# Patient Record
Sex: Female | Born: 1995 | Hispanic: Yes | Marital: Married | Smoking: Never smoker
Health system: Southern US, Community
[De-identification: ages and names within clinical notes are randomized; demographics above are authoritative.]

## PROBLEM LIST (undated history)

## (undated) DIAGNOSIS — D649 Anemia, unspecified: Secondary | ICD-10-CM

## (undated) DIAGNOSIS — E559 Vitamin D deficiency, unspecified: Secondary | ICD-10-CM

## (undated) DIAGNOSIS — I10 Essential (primary) hypertension: Secondary | ICD-10-CM

## (undated) HISTORY — DX: Vitamin D deficiency, unspecified: E55.9

## (undated) HISTORY — DX: Anemia, unspecified: D64.9

## (undated) HISTORY — DX: Essential (primary) hypertension: I10

---

## 2013-11-07 DIAGNOSIS — D649 Anemia, unspecified: Secondary | ICD-10-CM | POA: Insufficient documentation

## 2013-11-07 DIAGNOSIS — R55 Syncope and collapse: Secondary | ICD-10-CM | POA: Insufficient documentation

## 2020-08-27 DIAGNOSIS — R072 Precordial pain: Secondary | ICD-10-CM | POA: Diagnosis not present

## 2020-08-27 DIAGNOSIS — Z0000 Encounter for general adult medical examination without abnormal findings: Secondary | ICD-10-CM | POA: Diagnosis not present

## 2020-08-27 DIAGNOSIS — Z1159 Encounter for screening for other viral diseases: Secondary | ICD-10-CM | POA: Diagnosis not present

## 2020-08-27 DIAGNOSIS — R5383 Other fatigue: Secondary | ICD-10-CM | POA: Diagnosis not present

## 2020-08-27 DIAGNOSIS — Z131 Encounter for screening for diabetes mellitus: Secondary | ICD-10-CM | POA: Diagnosis not present

## 2020-08-27 DIAGNOSIS — E559 Vitamin D deficiency, unspecified: Secondary | ICD-10-CM | POA: Diagnosis not present

## 2020-08-27 DIAGNOSIS — D539 Nutritional anemia, unspecified: Secondary | ICD-10-CM | POA: Diagnosis not present

## 2020-08-27 DIAGNOSIS — E538 Deficiency of other specified B group vitamins: Secondary | ICD-10-CM | POA: Diagnosis not present

## 2020-08-27 DIAGNOSIS — H6123 Impacted cerumen, bilateral: Secondary | ICD-10-CM | POA: Diagnosis not present

## 2020-09-03 DIAGNOSIS — R200 Anesthesia of skin: Secondary | ICD-10-CM | POA: Diagnosis not present

## 2020-09-03 DIAGNOSIS — N920 Excessive and frequent menstruation with regular cycle: Secondary | ICD-10-CM | POA: Diagnosis not present

## 2020-09-03 DIAGNOSIS — Z862 Personal history of diseases of the blood and blood-forming organs and certain disorders involving the immune mechanism: Secondary | ICD-10-CM | POA: Diagnosis not present

## 2020-09-03 DIAGNOSIS — E611 Iron deficiency: Secondary | ICD-10-CM | POA: Diagnosis not present

## 2020-09-03 DIAGNOSIS — R5383 Other fatigue: Secondary | ICD-10-CM | POA: Diagnosis not present

## 2020-09-04 DIAGNOSIS — E559 Vitamin D deficiency, unspecified: Secondary | ICD-10-CM | POA: Insufficient documentation

## 2020-10-07 DIAGNOSIS — Z20822 Contact with and (suspected) exposure to covid-19: Secondary | ICD-10-CM | POA: Diagnosis not present

## 2020-10-15 DIAGNOSIS — N854 Malposition of uterus: Secondary | ICD-10-CM | POA: Diagnosis not present

## 2020-10-15 DIAGNOSIS — N920 Excessive and frequent menstruation with regular cycle: Secondary | ICD-10-CM | POA: Diagnosis not present

## 2020-11-12 DIAGNOSIS — R109 Unspecified abdominal pain: Secondary | ICD-10-CM | POA: Diagnosis not present

## 2020-11-12 DIAGNOSIS — R899 Unspecified abnormal finding in specimens from other organs, systems and tissues: Secondary | ICD-10-CM | POA: Diagnosis not present

## 2020-11-12 DIAGNOSIS — R10819 Abdominal tenderness, unspecified site: Secondary | ICD-10-CM | POA: Diagnosis not present

## 2020-11-12 DIAGNOSIS — R7301 Impaired fasting glucose: Secondary | ICD-10-CM | POA: Diagnosis not present

## 2020-11-14 DIAGNOSIS — Z3169 Encounter for other general counseling and advice on procreation: Secondary | ICD-10-CM | POA: Diagnosis not present

## 2020-11-14 DIAGNOSIS — N946 Dysmenorrhea, unspecified: Secondary | ICD-10-CM | POA: Diagnosis not present

## 2020-11-14 DIAGNOSIS — N921 Excessive and frequent menstruation with irregular cycle: Secondary | ICD-10-CM | POA: Diagnosis not present

## 2020-11-18 DIAGNOSIS — R10819 Abdominal tenderness, unspecified site: Secondary | ICD-10-CM | POA: Diagnosis not present

## 2020-11-18 DIAGNOSIS — R109 Unspecified abdominal pain: Secondary | ICD-10-CM | POA: Diagnosis not present

## 2021-01-08 DIAGNOSIS — R5383 Other fatigue: Secondary | ICD-10-CM | POA: Diagnosis not present

## 2021-01-08 DIAGNOSIS — E559 Vitamin D deficiency, unspecified: Secondary | ICD-10-CM | POA: Diagnosis not present

## 2021-01-08 DIAGNOSIS — N926 Irregular menstruation, unspecified: Secondary | ICD-10-CM | POA: Diagnosis not present

## 2021-01-08 DIAGNOSIS — R400 Somnolence: Secondary | ICD-10-CM | POA: Diagnosis not present

## 2021-01-21 DIAGNOSIS — R0683 Snoring: Secondary | ICD-10-CM | POA: Diagnosis not present

## 2021-01-21 DIAGNOSIS — E669 Obesity, unspecified: Secondary | ICD-10-CM | POA: Diagnosis not present

## 2021-01-21 DIAGNOSIS — Z6835 Body mass index (BMI) 35.0-35.9, adult: Secondary | ICD-10-CM | POA: Diagnosis not present

## 2021-01-21 DIAGNOSIS — G4710 Hypersomnia, unspecified: Secondary | ICD-10-CM | POA: Diagnosis not present

## 2021-03-19 DIAGNOSIS — B372 Candidiasis of skin and nail: Secondary | ICD-10-CM | POA: Diagnosis not present

## 2021-03-19 DIAGNOSIS — N926 Irregular menstruation, unspecified: Secondary | ICD-10-CM | POA: Diagnosis not present

## 2021-03-31 ENCOUNTER — Encounter: Payer: Self-pay | Admitting: Emergency Medicine

## 2021-03-31 ENCOUNTER — Emergency Department
Admission: EM | Admit: 2021-03-31 | Discharge: 2021-03-31 | Disposition: A | Discharge status: Home / Self Care | Payer: BC Managed Care – PPO | Source: Home / Self Care | Attending: Family Medicine | Admitting: Family Medicine

## 2021-03-31 ENCOUNTER — Other Ambulatory Visit: Payer: Self-pay

## 2021-03-31 DIAGNOSIS — R1032 Left lower quadrant pain: Secondary | ICD-10-CM | POA: Diagnosis not present

## 2021-03-31 LAB — POCT URINALYSIS DIP (MANUAL ENTRY)
Bilirubin, UA: NEGATIVE
Blood, UA: NEGATIVE
Glucose, UA: NEGATIVE mg/dL
Ketones, POC UA: NEGATIVE mg/dL
Leukocytes, UA: NEGATIVE
Nitrite, UA: NEGATIVE
Protein Ur, POC: NEGATIVE mg/dL
Spec Grav, UA: 1.025 (ref 1.010–1.025)
Urobilinogen, UA: 0.2 E.U./dL
pH, UA: 7 (ref 5.0–8.0)

## 2021-03-31 LAB — POCT URINE PREGNANCY: Preg Test, Ur: NEGATIVE

## 2021-03-31 MED ORDER — KETOROLAC TROMETHAMINE 60 MG/2ML IM SOLN
60.0000 mg | Freq: Once | INTRAMUSCULAR | Status: AC
  Administered 2021-03-31: 60 mg via INTRAMUSCULAR

## 2021-03-31 NOTE — ED Notes (Signed)
Pt setting up a new PCP appointment - switching from [facility]

## 2021-03-31 NOTE — ED Triage Notes (Signed)
Left lower  quadrant pain since Sunday night  Feels tight  No OTC meds  Denies diarrhea or constipation Period of nausea Here w/ her husband

## 2021-03-31 NOTE — ED Provider Notes (Signed)
[name]    CSN: [id number] Arrival date & time: 03/31/21  0859      History   Chief Complaint Chief Complaint  Patient presents with   Abdominal Pain    HPI Tanya Yates is a 26 y.o. female.   HPI  Healthy 26 year old here with left lower quadrant abdominal pain.  Crampy in nature.  Comes and goes.  Worse with deep breath.  Present since Sunday (2 days ago).  No fever or chills.  Decreased appetite but no vomiting.  No diarrhea.  Last bowel movement was today.  Last menstrual period was 03/17/2020. Urinalysis and urine pregnancy test are negative today. No history of kidney stones or kidney infections.  No history of GYN medical problems or ovarian cysts.  No colon problems, colitis, food allergies.  History reviewed. No pertinent past medical history.  There are no problems to display for this patient.   History reviewed. No pertinent surgical history.  OB History   No obstetric history on file.      Home Medications    Prior to Admission medications   Medication Sig Start Date End Date Taking? Authorizing Provider  clotrimazole-betamethasone (LOTRISONE) lotion Apply topically. 03/19/21   [provider]    Family History Family History  Problem Relation Age of Onset   Healthy Mother    Healthy Father     Social History Social History   Tobacco Use   Smoking status: Never    Passive exposure: Never   Smokeless tobacco: Never  Vaping Use   Vaping Use: Never used  Substance Use Topics   Alcohol use: Not Currently   Drug use: Never     Allergies   Patient has no known allergies.   Review of Systems Review of Systems See HPI  Physical Exam Triage Vital Signs ED Triage Vitals  Enc Vitals Group     BP 03/31/21 0922 (!) 137/92     Pulse Rate 03/31/21 0922 (!) 112     Resp 03/31/21 0922 16     Temp 03/31/21 0922 98.6 F (37 C)     Temp Source 03/31/21 0922 Oral     SpO2 03/31/21 0922 99 %     Weight  03/31/21 0924 204 lb (92.5 kg)     Height 03/31/21 0924 5\' 3"  (1.6 m)     Head Circumference --      Peak Flow --      Pain Score 03/31/21 0923 3     Pain Loc --      Pain Edu? --      Excl. in [place]? --    No data found.  Updated Vital Signs BP (!) 137/92 (BP Location: Left Arm)    Pulse 79    Temp 98.6 F (37 C) (Oral)    Resp 16    Ht 5\' 3"  (1.6 m)    Wt 92.5 kg    LMP 03/17/2021 (Exact Date)    SpO2 98%    BMI 36.14 kg/m      Physical Exam Constitutional:      General: She is not in acute distress.    Appearance: She is well-developed. She is obese.     Comments: Appears uncomfortable.  HENT:     Head: Normocephalic and atraumatic.     Mouth/Throat:     Comments: Mask is in place Eyes:     Conjunctiva/sclera: Conjunctivae normal.     Pupils: Pupils are equal, round, and reactive to light.  Cardiovascular:     Rate and Rhythm: Normal rate and regular rhythm.     Heart sounds: Normal heart sounds.  Pulmonary:     Effort: Pulmonary effort is normal. No respiratory distress.     Breath sounds: Normal breath sounds.  Chest:     Chest wall: No tenderness.  Abdominal:     General: Abdomen is flat. There is no distension.     Palpations: Abdomen is soft.     Tenderness: There is abdominal tenderness. There is no right CVA tenderness, left CVA tenderness, guarding or rebound.     Comments: Moderate tenderness to deep palpation in the left lower and mid abdomen.  No organomegaly.  No mass.  No guarding or rebound.  Musculoskeletal:        General: Normal range of motion.     Cervical back: Normal range of motion.  Skin:    General: Skin is warm and dry.  Neurological:     Mental Status: She is alert.     UC Treatments / Results  Labs (all labs ordered are listed, but only abnormal results are displayed) Labs Reviewed  POCT URINE PREGNANCY  POCT URINALYSIS DIP (MANUAL ENTRY)    EKG   Radiology No results found.  Procedures Procedures (including critical care  time)  Medications Ordered in UC Medications  ketorolac (TORADOL) injection 60 mg (60 mg Intramuscular Given 03/31/21 0959)    Initial Impression / Assessment and Plan / UC Course  I have reviewed the triage vital signs and the nursing notes.  Pertinent labs & imaging results that were available during my care of the patient were reviewed by me and considered in my medical decision making (see chart for details).     We will give a shot of Toradol.  Bland diet.  Probiotics.  Watch for worsening symptoms.  Would do additional evaluation or scanning if she fails to improve, or symptoms worsen.  Discussed with patient and husband Final Clinical Impressions(s) / UC Diagnoses   Final diagnoses:  None     Discharge Instructions      Take Aleve 2 pills in the morning and 2 pills at night for the abdominal crampy pain Drink lots of water. Avoid highly spicy food and fried foods until the pain improves Consider a probiotic to reduce colon gas and pain Return here or go to the ER if you become much worse, have fevers, have vomiting   ED Prescriptions   None    PDMP not reviewed this encounter.   Raylene Everts, MD 03/31/21 1054

## 2021-03-31 NOTE — Discharge Instructions (Signed)
Take Aleve 2 pills in the morning and 2 pills at night for the abdominal crampy pain Drink lots of water. Avoid highly spicy food and fried foods until the pain improves Consider a probiotic to reduce colon gas and pain Return here or go to the ER if you become much worse, have fevers, have vomiting

## 2021-04-02 ENCOUNTER — Emergency Department
Admission: RE | Admit: 2021-04-02 | Discharge: 2021-04-02 | Disposition: A | Discharge status: Home / Self Care | Payer: BC Managed Care – PPO | Source: Ambulatory Visit

## 2021-04-02 ENCOUNTER — Emergency Department (INDEPENDENT_AMBULATORY_CARE_PROVIDER_SITE_OTHER): Payer: BC Managed Care – PPO

## 2021-04-02 ENCOUNTER — Other Ambulatory Visit: Payer: Self-pay

## 2021-04-02 VITALS — BP 146/95 | HR 102 | Temp 98.6°F | Resp 18

## 2021-04-02 DIAGNOSIS — K6389 Other specified diseases of intestine: Secondary | ICD-10-CM

## 2021-04-02 DIAGNOSIS — R1084 Generalized abdominal pain: Secondary | ICD-10-CM

## 2021-04-02 DIAGNOSIS — K5900 Constipation, unspecified: Secondary | ICD-10-CM | POA: Diagnosis not present

## 2021-04-02 DIAGNOSIS — R109 Unspecified abdominal pain: Secondary | ICD-10-CM | POA: Diagnosis not present

## 2021-04-02 DIAGNOSIS — R1032 Left lower quadrant pain: Secondary | ICD-10-CM

## 2021-04-02 MED ORDER — IOHEXOL 300 MG/ML  SOLN
100.0000 mL | Freq: Once | INTRAMUSCULAR | Status: AC | PRN
  Administered 2021-04-02: 100 mL via INTRAVENOUS

## 2021-04-02 NOTE — Discharge Instructions (Addendum)
Advised/informed patient of results of CT of abdomen and pelvis with contrast.  Provided hard copy for patient with this AVS.  Advised patient to follow-up with PCP for GI consult given impression of this CT study for Epiploic appendagitis and benign appearing right ovarian cyst.

## 2021-04-02 NOTE — ED Provider Notes (Signed)
[facility]    CSN: [id number] Arrival date & time: 04/02/21  0954      History   Chief Complaint Chief Complaint  Patient presents with   Abdominal Pain    W/ cramping    HPI Tanya Yates is a 26 y.o. female.   HPI 26 year old female presents with abdominal pain and cramping for days.  Denies nausea and vomiting.  Feels bloated and constipated.  Patient was a weighted here on Tuesday, 03/31/2021 (2 days ago) was given Toradol and recommended to take ibuprofen for abdominal pain.  She has had abdominal ultrasound performed by GYN on 11/14/2020 which was in normal limits and unremarkable this provider recommended GI consult at that time.  History reviewed. No pertinent past medical history.  There are no problems to display for this patient.   History reviewed. No pertinent surgical history.  OB History   No obstetric history on file.      Home Medications    Prior to Admission medications   Medication Sig Start Date End Date Taking? Authorizing Provider  clotrimazole-betamethasone (LOTRISONE) lotion Apply topically. 03/19/21   [provider]    Family History Family History  Problem Relation Age of Onset   Healthy Mother    Healthy Father     Social History Social History   Tobacco Use   Smoking status: Never    Passive exposure: Never   Smokeless tobacco: Never  Vaping Use   Vaping Use: Never used  Substance Use Topics   Alcohol use: Not Currently   Drug use: Never     Allergies   Patient has no known allergies.   Review of Systems Review of Systems  Gastrointestinal:  Positive for abdominal pain.  All other systems reviewed and are negative.   Physical Exam Triage Vital Signs ED Triage Vitals  Enc Vitals Group     BP 04/02/21 1021 (!) 146/95     Pulse Rate 04/02/21 1021 (!) 102     Resp 04/02/21 1021 18     Temp 04/02/21 1021 98.6 F (37 C)     Temp Source 04/02/21 1021 Oral     SpO2 04/02/21 1021  98 %     Weight --      Height --      Head Circumference --      Peak Flow --      Pain Score 04/02/21 1022 7     Pain Loc --      Pain Edu? --      Excl. in [place]? --    No data found.  Updated Vital Signs BP (!) 146/95 (BP Location: Right Arm)    Pulse (!) 102    Temp 98.6 F (37 C) (Oral)    Resp 18    LMP 03/17/2021 (Exact Date)    SpO2 98%      Physical Exam Vitals and nursing note reviewed.  Constitutional:      General: She is not in acute distress.    Appearance: She is well-developed. She is obese. She is not ill-appearing.  HENT:     Head: Normocephalic and atraumatic.     Mouth/Throat:     Mouth: Mucous membranes are moist.     Pharynx: Oropharynx is clear.  Eyes:     Extraocular Movements: Extraocular movements intact.     Pupils: Pupils are equal, round, and reactive to light.  Cardiovascular:     Rate and Rhythm: Normal rate and regular rhythm.  Heart sounds: Normal heart sounds. No murmur heard.   No friction rub. No gallop.  Pulmonary:     Effort: Pulmonary effort is normal.     Breath sounds: Normal breath sounds.  Abdominal:     General: Abdomen is flat. Bowel sounds are absent. There is no distension or abdominal bruit.     Palpations: Abdomen is soft. There is no shifting dullness, fluid wave, hepatomegaly, splenomegaly, mass or pulsatile mass.     Tenderness: There is abdominal tenderness in the left lower quadrant. There is no right CVA tenderness, left CVA tenderness, guarding or rebound. Negative signs include Murphy's sign and McBurney's sign.     Hernia: No hernia is present.  Neurological:     Mental Status: She is alert.     UC Treatments / Results  Labs (all labs ordered are listed, but only abnormal results are displayed) Labs Reviewed - No data to display  EKG   Radiology DG Abdomen 1 View  Result Date: 04/02/2021 CLINICAL DATA:  Abdominal pain and cramping for 4 days. Bloating. Constipation. EXAM: ABDOMEN - 1 VIEW COMPARISON:   None. FINDINGS: There is some formed stool in the ascending colon but overall the stool burden in the abdomen is relatively low and within normal limits. No dilated bowel is identified, although most of the small bowel is gasless. No significant abnormal calcifications are identified. Mild levoconvex lumbar rotary scoliosis. IMPRESSION: 1. Unremarkable bowel gas pattern. 2. Mild levoconvex lumbar rotary scoliosis. Electronically Signed   By: Van Clines M.D.   On: 04/02/2021 10:52   CT ABDOMEN PELVIS W CONTRAST  Result Date: 04/02/2021 CLINICAL DATA:  Left lower quadrant abdominal pain with cramping. EXAM: CT ABDOMEN AND PELVIS WITH CONTRAST TECHNIQUE: Multidetector CT imaging of the abdomen and pelvis was performed using the standard protocol following bolus administration of intravenous contrast. RADIATION DOSE REDUCTION: This exam was performed according to the departmental dose-optimization program which includes automated exposure control, adjustment of the mA and/or kV according to patient size and/or use of iterative reconstruction technique. CONTRAST:  116mL OMNIPAQUE IOHEXOL 300 MG/ML  SOLN COMPARISON:  None. FINDINGS: Lower chest: Unremarkable Hepatobiliary: No suspicious focal abnormality within the liver parenchyma. There is no evidence for gallstones, gallbladder wall thickening, or pericholecystic fluid. No intrahepatic or extrahepatic biliary dilation. Pancreas: No focal mass lesion. No dilatation of the main duct. No intraparenchymal cyst. No peripancreatic edema. Spleen: No splenomegaly. No focal mass lesion. Adrenals/Urinary Tract: No adrenal nodule or mass. Kidneys unremarkable. No evidence for hydroureter. The urinary bladder appears normal for the degree of distention. Stomach/Bowel: Stomach is unremarkable. No gastric wall thickening. No evidence of outlet obstruction. Duodenum is normally positioned as is the ligament of Treitz. No small bowel wall thickening. No small bowel  dilatation. The terminal ileum is normal. The appendix is normal. No gross colonic mass. No colonic wall thickening. Oval ring of edema/inflammation identified immediately adjacent to the distal descending colon (well demonstrated axial image 54 series 2) is characteristic for epiploic appendagitis. Vascular/Lymphatic: No abdominal aortic aneurysm. There is no gastrohepatic or hepatoduodenal ligament lymphadenopathy. No retroperitoneal or mesenteric lymphadenopathy. No pelvic sidewall lymphadenopathy. Reproductive: The uterus is unremarkable. 3.1 cm benign appearing cyst or dominant follicle noted right ovary. Left ovary unremarkable. Other: No intraperitoneal free fluid. Musculoskeletal: No worrisome lytic or sclerotic osseous abnormality. IMPRESSION: 1. Oval ring of edema/inflammation immediately adjacent to the distal descending colon is characteristic for epiploic appendagitis. 2. 3.1 cm benign appearing cyst or dominant follicle noted right ovary. No  follow-up imaging recommended. Note: This recommendation does not apply to premenarchal patients and to those with increased risk (genetic, family history, elevated tumor markers or other high-risk factors) of ovarian cancer. Reference: JACR 2020 Feb; 17(2):248-254 Electronically Signed   By: Misty Stanley M.D.   On: 04/02/2021 11:46    Procedures Procedures (including critical care time)  Medications Ordered in UC Medications - No data to display  Initial Impression / Assessment and Plan / UC Course  I have reviewed the triage vital signs and the nursing notes.  Pertinent labs & imaging results that were available during my care of the patient were reviewed by me and considered in my medical decision making (see chart for details).     MDM: 1.  Left lower quadrant abdominal pain-KUB revealed above-unremarkable bowel gas pattern mild levoconvex lumbar rotary scoliosis.  CT of abdomen and pelvis with contrast revealed above. Advised/informed patient  of results of CT of abdomen and pelvis with contrast.  Provided hard copy for patient with this AVS.  Advised patient to follow-up with PCP for GI consult given impression of this CT study for Epiploic appendagitis and benign appearing right ovarian cyst.  Work note provided prior to discharge.  Discharged home, hemodynamically stable. Final Clinical Impressions(s) / UC Diagnoses   Final diagnoses:  Abdominal pain, left lower quadrant  Epiploic appendagitis     Discharge Instructions      Advised/informed patient of results of CT of abdomen and pelvis with contrast.  Provided hard copy for patient with this AVS.  Advised patient to follow-up with PCP for GI consult given impression of this CT study for Epiploic appendagitis and benign appearing right ovarian cyst.     ED Prescriptions   None    PDMP not reviewed this encounter.   Eliezer Lofts, [place] 04/02/21 1226

## 2021-04-02 NOTE — ED Triage Notes (Addendum)
Pt here today with husband. Pt c/o adb pain and cramping that started Sunday. Pain is intermittent. Denies nausea and vomiting. Feels bloated and constipated. Was seen here at [facility] on Tues. Given Toradol. Also recommended to take Ibuprofen prn.

## 2021-04-08 DIAGNOSIS — N83201 Unspecified ovarian cyst, right side: Secondary | ICD-10-CM | POA: Diagnosis not present

## 2021-04-08 DIAGNOSIS — K6389 Other specified diseases of intestine: Secondary | ICD-10-CM | POA: Diagnosis not present

## 2021-04-13 DIAGNOSIS — K6389 Other specified diseases of intestine: Secondary | ICD-10-CM | POA: Diagnosis not present

## 2021-05-25 ENCOUNTER — Ambulatory Visit: Payer: BC Managed Care – PPO | Admitting: Medical-Surgical

## 2021-05-26 DIAGNOSIS — N926 Irregular menstruation, unspecified: Secondary | ICD-10-CM | POA: Diagnosis not present

## 2021-05-26 DIAGNOSIS — R350 Frequency of micturition: Secondary | ICD-10-CM | POA: Diagnosis not present

## 2021-07-20 DIAGNOSIS — L299 Pruritus, unspecified: Secondary | ICD-10-CM | POA: Diagnosis not present

## 2021-07-20 DIAGNOSIS — R5383 Other fatigue: Secondary | ICD-10-CM | POA: Diagnosis not present

## 2021-07-20 DIAGNOSIS — R221 Localized swelling, mass and lump, neck: Secondary | ICD-10-CM | POA: Diagnosis not present

## 2021-07-20 DIAGNOSIS — B372 Candidiasis of skin and nail: Secondary | ICD-10-CM | POA: Diagnosis not present

## 2021-09-02 DIAGNOSIS — D508 Other iron deficiency anemias: Secondary | ICD-10-CM | POA: Diagnosis not present

## 2021-09-02 DIAGNOSIS — R0600 Dyspnea, unspecified: Secondary | ICD-10-CM | POA: Diagnosis not present

## 2021-09-02 DIAGNOSIS — R531 Weakness: Secondary | ICD-10-CM | POA: Diagnosis not present

## 2021-09-02 DIAGNOSIS — R002 Palpitations: Secondary | ICD-10-CM | POA: Diagnosis not present

## 2021-09-02 DIAGNOSIS — E559 Vitamin D deficiency, unspecified: Secondary | ICD-10-CM | POA: Diagnosis not present

## 2021-09-02 DIAGNOSIS — R5383 Other fatigue: Secondary | ICD-10-CM | POA: Diagnosis not present

## 2021-09-23 DIAGNOSIS — R55 Syncope and collapse: Secondary | ICD-10-CM | POA: Diagnosis not present

## 2021-09-23 DIAGNOSIS — R079 Chest pain, unspecified: Secondary | ICD-10-CM | POA: Diagnosis not present

## 2021-09-23 DIAGNOSIS — R0602 Shortness of breath: Secondary | ICD-10-CM | POA: Diagnosis not present

## 2021-10-13 DIAGNOSIS — D508 Other iron deficiency anemias: Secondary | ICD-10-CM | POA: Diagnosis not present

## 2021-10-13 DIAGNOSIS — Z124 Encounter for screening for malignant neoplasm of cervix: Secondary | ICD-10-CM | POA: Diagnosis not present

## 2021-10-13 DIAGNOSIS — L659 Nonscarring hair loss, unspecified: Secondary | ICD-10-CM | POA: Diagnosis not present

## 2021-10-13 DIAGNOSIS — E669 Obesity, unspecified: Secondary | ICD-10-CM | POA: Diagnosis not present

## 2021-10-13 DIAGNOSIS — E559 Vitamin D deficiency, unspecified: Secondary | ICD-10-CM | POA: Diagnosis not present

## 2021-10-13 DIAGNOSIS — Z23 Encounter for immunization: Secondary | ICD-10-CM | POA: Diagnosis not present

## 2021-10-13 DIAGNOSIS — Z0000 Encounter for general adult medical examination without abnormal findings: Secondary | ICD-10-CM | POA: Diagnosis not present

## 2021-10-13 DIAGNOSIS — R5383 Other fatigue: Secondary | ICD-10-CM | POA: Diagnosis not present

## 2021-10-20 LAB — HM PAP SMEAR

## 2021-10-22 DIAGNOSIS — E611 Iron deficiency: Secondary | ICD-10-CM | POA: Diagnosis not present

## 2021-10-22 DIAGNOSIS — E559 Vitamin D deficiency, unspecified: Secondary | ICD-10-CM | POA: Diagnosis not present

## 2021-10-22 DIAGNOSIS — N920 Excessive and frequent menstruation with regular cycle: Secondary | ICD-10-CM | POA: Diagnosis not present

## 2021-10-22 DIAGNOSIS — R5383 Other fatigue: Secondary | ICD-10-CM | POA: Diagnosis not present

## 2022-02-24 ENCOUNTER — Ambulatory Visit (INDEPENDENT_AMBULATORY_CARE_PROVIDER_SITE_OTHER): Payer: BC Managed Care – PPO

## 2022-02-24 ENCOUNTER — Ambulatory Visit
Admission: RE | Admit: 2022-02-24 | Discharge: 2022-02-24 | Disposition: A | Discharge status: Home / Self Care | Payer: BC Managed Care – PPO | Source: Ambulatory Visit | Attending: Family Medicine | Admitting: Family Medicine

## 2022-02-24 VITALS — BP 138/97 | HR 108 | Temp 98.5°F | Resp 14

## 2022-02-24 DIAGNOSIS — M2142 Flat foot [pes planus] (acquired), left foot: Secondary | ICD-10-CM

## 2022-02-24 DIAGNOSIS — M79671 Pain in right foot: Secondary | ICD-10-CM

## 2022-02-24 DIAGNOSIS — M2141 Flat foot [pes planus] (acquired), right foot: Secondary | ICD-10-CM

## 2022-02-24 DIAGNOSIS — M7661 Achilles tendinitis, right leg: Secondary | ICD-10-CM

## 2022-02-24 MED ORDER — PREDNISONE 50 MG PO TABS: ORAL_TABLET | ORAL | 0 refills | Status: DC

## 2022-02-24 NOTE — ED Provider Notes (Signed)
[facility]    CSN: [id number] Arrival date & time: 02/24/22  1857      History   Chief Complaint Chief Complaint  Patient presents with   Foot Pain    HPI Tanya Yates is a 26 y.o. female.   HPI Patient had absolutely normal day Monday.  Woke up Tuesday and could not put weight on her right heel.  Has been limping on it ever since then.  She works on her feet a lot of the time.  She wears a steel toe shoe.  No recent change in activity.  No recent change in shoewear.  No trauma or injury. History reviewed. No pertinent past medical history.  There are no problems to display for this patient.   History reviewed. No pertinent surgical history.  OB History   No obstetric history on file.      Home Medications    Prior to Admission medications   Medication Sig Start Date End Date Taking? Authorizing Provider  predniSONE (DELTASONE) 50 MG tablet Take once a day for 5 days.  Take with food 02/24/22  Yes Eustace Moore, MD    Family History Family History  Problem Relation Age of Onset   Healthy Mother    Healthy Father     Social History Social History   Tobacco Use   Smoking status: Never    Passive exposure: Never   Smokeless tobacco: Never  Vaping Use   Vaping Use: Never used  Substance Use Topics   Alcohol use: Not Currently   Drug use: Never     Allergies   Patient has no known allergies.   Review of Systems Review of Systems See HPI  Physical Exam Triage Vital Signs ED Triage Vitals  Enc Vitals Group     BP 02/24/22 1909 (!) 138/97     Pulse Rate 02/24/22 1909 (!) 108     Resp 02/24/22 1909 14     Temp 02/24/22 1909 98.5 F (36.9 C)     Temp Source 02/24/22 1909 Oral     SpO2 02/24/22 1909 98 %     Weight --      Height --      Head Circumference --      Peak Flow --      Pain Score 02/24/22 1908 6     Pain Loc --      Pain Edu? --      Excl. in [place]? --    No data found.  Updated Vital  Signs BP (!) 138/97 (BP Location: Right Arm)   Pulse (!) 108   Temp 98.5 F (36.9 C) (Oral)   Resp 14   SpO2 98%       Physical Exam Constitutional:      General: She is not in acute distress.    Appearance: She is well-developed.  HENT:     Head: Normocephalic and atraumatic.  Eyes:     Conjunctiva/sclera: Conjunctivae normal.     Pupils: Pupils are equal, round, and reactive to light.  Cardiovascular:     Rate and Rhythm: Normal rate.  Pulmonary:     Effort: Pulmonary effort is normal. No respiratory distress.  Abdominal:     General: There is no distension.     Palpations: Abdomen is soft.  Musculoskeletal:        General: Tenderness present. No swelling, deformity or signs of injury. Normal range of motion.     Cervical back: Normal range of  motion.     Right lower leg: No edema.     Left lower leg: Edema present.       Feet:  Skin:    General: Skin is warm and dry.  Neurological:     Mental Status: She is alert.     Gait: Gait abnormal.      UC Treatments / Results  Labs (all labs ordered are listed, but only abnormal results are displayed) Labs Reviewed - No data to display  EKG   Radiology DG Foot 2 Views Right  Result Date: 02/24/2022 CLINICAL DATA:  Pain right heel EXAM: RIGHT FOOT - 2 VIEW COMPARISON:  None Available. FINDINGS: No fracture or dislocation is seen. There is no demonstrable plantar spur. No abnormal soft tissue calcifications are seen. There are no opaque foreign bodies. IMPRESSION: No radiographic abnormalities are seen in right foot with attention to the heel. Electronically Signed   By: Ernie Avena M.D.   On: 02/24/2022 19:36    Procedures Procedures (including critical care time)  Medications Ordered in UC Medications - No data to display  Initial Impression / Assessment and Plan / UC Course  I have reviewed the triage vital signs and the nursing notes.  Pertinent labs & imaging results that were available during my  care of the patient were reviewed by me and considered in my medical decision making (see chart for details).     Will commend stretching, ice, anti-inflammatories, reexamine shoewear, see sports medicine if fails to improve Final Clinical Impressions(s) / UC Diagnoses   Final diagnoses:  Pain of right heel  Achilles tendinitis of right lower extremity  Pes planus of both feet     Discharge Instructions      Stretch heel as I demonstrated Use ice for 20 minutes every couple of hours Take prednisone once a day for 5 days When you are finished with the prednisone take ibuprofen as needed for pain, 600 mg to 800 mg with food If you continue to have pain consider sports medicine specialty    ED Prescriptions     Medication Sig Dispense Auth. Provider   predniSONE (DELTASONE) 50 MG tablet Take once a day for 5 days.  Take with food 5 tablet Eustace Moore, MD      PDMP not reviewed this encounter.   Eustace Moore, MD 02/24/22 Rosamaria Lints

## 2022-02-24 NOTE — Discharge Instructions (Signed)
Stretch heel as I demonstrated Use ice for 20 minutes every couple of hours Take prednisone once a day for 5 days When you are finished with the prednisone take ibuprofen as needed for pain, 600 mg to 800 mg with food If you continue to have pain consider sports medicine specialty

## 2022-02-24 NOTE — ED Triage Notes (Signed)
Pt presents with rt heel pain began after waking up yesterday AM. Pt denies any injuries

## 2022-05-28 ENCOUNTER — Ambulatory Visit: Payer: BC Managed Care – PPO | Admitting: Family Medicine

## 2022-06-15 ENCOUNTER — Encounter: Payer: Self-pay | Admitting: Family Medicine

## 2022-06-15 ENCOUNTER — Ambulatory Visit: Payer: BC Managed Care – PPO | Admitting: Family Medicine

## 2022-06-15 VITALS — BP 143/102 | HR 113 | Temp 98.3°F | Ht 63.0 in | Wt 206.0 lb

## 2022-06-15 DIAGNOSIS — Z6836 Body mass index (BMI) 36.0-36.9, adult: Secondary | ICD-10-CM

## 2022-06-15 DIAGNOSIS — R5383 Other fatigue: Secondary | ICD-10-CM | POA: Diagnosis not present

## 2022-06-15 DIAGNOSIS — D509 Iron deficiency anemia, unspecified: Secondary | ICD-10-CM

## 2022-06-15 DIAGNOSIS — R635 Abnormal weight gain: Secondary | ICD-10-CM

## 2022-06-15 DIAGNOSIS — E559 Vitamin D deficiency, unspecified: Secondary | ICD-10-CM | POA: Diagnosis not present

## 2022-06-15 DIAGNOSIS — I10 Essential (primary) hypertension: Secondary | ICD-10-CM | POA: Diagnosis not present

## 2022-06-15 MED ORDER — LISINOPRIL 10 MG PO TABS: 10.0000 mg | ORAL_TABLET | Freq: Every day | ORAL | 3 refills | Status: DC

## 2022-06-15 NOTE — Assessment & Plan Note (Signed)
-   have ordered b12 level

## 2022-06-15 NOTE — Progress Notes (Signed)
Established patient visit   Patient: Tanya Yates   DOB: 15-Jun-1995   26 y.o. Female  MRN: [id number] Visit Date: 06/15/2022  Today's healthcare provider: Charlton Amor, DO   Chief Complaint  Patient presents with   Establish Care   Fatigue    Pt would like to check iron levels.    Menorrhagia    She says she has-had since 27 y/o.   Hypertension    Pt was recently seen at the Dentist and was told her BP was elevated. She complains of HA, facial swelling and HA. She denies CP, SOB or visual changes.    SUBJECTIVE     HPI HPI     Fatigue    Additional comments: Pt would like to check iron levels.         Menorrhagia    Additional comments: She says she has-had since 27 y/o.        Hypertension    Additional comments: Pt was recently seen at the Dentist and was told her BP was elevated. She complains of HA, facial swelling and HA. She denies CP, SOB or visual changes.      Last edited by Yolanda Manges, CMA on 06/15/2022  4:09 PM.      Pt presents to establish care. She has concerns of fatigue today. She has a history of IDA. She has migraines when she takes iron supplements. She feels like she sleeps all the time.  She is struggling for weight loss. She is walking 3 days a week for 1 mile and a half. She drinks a lot of water and tries to do small portions. This is the heaviest she has ever been. BP is elevated.   Review of Systems  Constitutional:  Negative for activity change, fatigue and fever.  Respiratory:  Negative for cough and shortness of breath.   Cardiovascular:  Negative for chest pain.  Gastrointestinal:  Negative for abdominal pain.  Genitourinary:  Negative for difficulty urinating.       Current Meds  Medication Sig   lisinopril (ZESTRIL) 10 MG tablet Take 1 tablet (10 mg total) by mouth daily.    OBJECTIVE    BP (!) 143/102 (BP Location: Left Arm, Patient Position: Sitting, Cuff Size: Large)   Pulse (!) 113    Temp 98.3 F (36.8 C) (Oral)   Ht  (1.6 m)   Wt 206 lb 0.6 oz (93.5 kg)   LMP 06/12/2022 (Exact Date)   SpO2 100%   BMI 36.50 kg/m   Physical Exam Vitals and nursing note reviewed.  Constitutional:      General: She is not in acute distress.    Appearance: Normal appearance.  HENT:     Head: Normocephalic and atraumatic.     Right Ear: External ear normal.     Left Ear: External ear normal.     Nose: Nose normal.  Eyes:     Conjunctiva/sclera: Conjunctivae normal.  Cardiovascular:     Rate and Rhythm: Normal rate and regular rhythm.  Pulmonary:     Effort: Pulmonary effort is normal.     Breath sounds: Normal breath sounds.  Neurological:     General: No focal deficit present.     Mental Status: She is alert and oriented to person, place, and time.  Psychiatric:        Mood and Affect: Mood normal.        Behavior: Behavior normal.  Thought Content: Thought content normal.        Judgment: Judgment normal.        ASSESSMENT & PLAN    Problem List Items Addressed This Visit       Cardiovascular and Mediastinum   Primary hypertension    - elevated bp and pt has elevated bp at the dentist. Have sent in lisinopril  daily  - will follow up in two weeks for reassessment      Relevant Medications   lisinopril (ZESTRIL) 10 MG tablet   Other Relevant Orders   COMPLETE METABOLIC PANEL WITH GFR   Microalbumin / creatinine urine ratio     Other   Other fatigue - Primary    - have ordered b12 level        Relevant Orders   Vitamin B12   Anemia    - pt has hx of IDA secondary to heavy periods will order iron panel to assess  - if abnormal pt will need infusion as she cannot tolerate oral supplements      Relevant Orders   Fe+TIBC+Fer   Vitamin D deficiency    - have ordered vit d lab to assess for need for supplement      Relevant Orders   Vitamin D (25 hydroxy)   Class 2 severe obesity due to excess calories with serious comorbidity and  body mass index (BMI) of 36.0 to 36.9 in adult    - have ordered A1c to assess for diabetes - if A1c is negative, will do wegovy      Relevant Orders   Vitamin D (25 hydroxy)   HgB A1c   Other Visit Diagnoses     Weight gain       Relevant Orders   TSH + free T4   HgB A1c       Return in about 2 weeks (around 06/29/2022).      Meds ordered this encounter  Medications   lisinopril (ZESTRIL) 10 MG tablet    Sig: Take 1 tablet (10 mg total) by mouth daily.    Dispense:  90 tablet    Refill:  3    Orders Placed This Encounter  Procedures   Vitamin D (25 hydroxy)   Vitamin B12   TSH + free T4   HgB A1c   COMPLETE METABOLIC PANEL WITH GFR   Microalbumin / creatinine urine ratio   Fe+TIBC+Fer     Charlton Amor, DO  [facility] Primary Care & Sports Medicine at [facility] [phone] (phone) [phone] (fax)  [facility]

## 2022-06-15 NOTE — Assessment & Plan Note (Signed)
-   have ordered vit d lab to assess for need for supplement

## 2022-06-15 NOTE — Assessment & Plan Note (Signed)
-   pt has hx of IDA secondary to heavy periods will order iron panel to assess  - if abnormal pt will need infusion as she cannot tolerate oral supplements

## 2022-06-15 NOTE — Assessment & Plan Note (Signed)
-   have ordered A1c to assess for diabetes - if A1c is negative, will do wegovy

## 2022-06-15 NOTE — Assessment & Plan Note (Signed)
-   elevated bp and pt has elevated bp at the dentist. Have sent in lisinopril  daily  - will follow up in two weeks for reassessment

## 2022-06-16 ENCOUNTER — Other Ambulatory Visit: Payer: Self-pay | Admitting: Family Medicine

## 2022-06-16 ENCOUNTER — Encounter: Payer: Self-pay | Admitting: Family Medicine

## 2022-06-16 LAB — COMPLETE METABOLIC PANEL WITH GFR
AG Ratio: 1.4 (calc) (ref 1.0–2.5)
ALT: 17 U/L (ref 6–29)
AST: 18 U/L (ref 10–30)
Albumin: 4.7 g/dL (ref 3.6–5.1)
Alkaline phosphatase (APISO): 75 U/L (ref 31–125)
BUN: 12 mg/dL (ref 7–25)
CO2: 25 mmol/L (ref 20–32)
Calcium: 9.7 mg/dL (ref 8.6–10.2)
Chloride: 101 mmol/L (ref 98–110)
Creat: 0.73 mg/dL (ref 0.50–0.96)
Globulin: 3.3 g/dL (calc) (ref 1.9–3.7)
Glucose, Bld: 83 mg/dL (ref 65–99)
Potassium: 4.1 mmol/L (ref 3.5–5.3)
Sodium: 137 mmol/L (ref 135–146)
Total Bilirubin: 0.4 mg/dL (ref 0.2–1.2)
Total Protein: 8 g/dL (ref 6.1–8.1)
eGFR: 115 mL/min/{1.73_m2} (ref >=60)

## 2022-06-16 LAB — IRON,TIBC AND FERRITIN PANEL
%SAT: 10 % (calc) — ABNORMAL LOW (ref 16–45)
Ferritin: 22 ng/mL (ref 16–154)
Iron: 44 ug/dL (ref 40–190)
TIBC: 423 mcg/dL (calc) (ref 250–450)

## 2022-06-16 LAB — HEMOGLOBIN A1C
Hgb A1c MFr Bld: 5.9 % of total Hgb — ABNORMAL HIGH (ref <5.7)
Mean Plasma Glucose: 123 mg/dL
eAG (mmol/L): 6.8 mmol/L

## 2022-06-16 LAB — MICROALBUMIN / CREATININE URINE RATIO
Creatinine, Urine: 131 mg/dL (ref 20–275)
Microalb Creat Ratio: 5 mg/g creat (ref <30)
Microalb, Ur: 0.7 mg/dL

## 2022-06-16 LAB — TSH+FREE T4: TSH W/REFLEX TO FT4: 2.09 mIU/L

## 2022-06-16 LAB — VITAMIN D 25 HYDROXY (VIT D DEFICIENCY, FRACTURES): Vit D, 25-Hydroxy: 32 ng/mL (ref 30–100)

## 2022-06-16 LAB — VITAMIN B12: Vitamin B-12: 537 pg/mL (ref 200–1100)

## 2022-06-16 MED ORDER — WEGOVY 0.25 MG/0.5ML ~~LOC~~ SOAJ
0.2500 mg | SUBCUTANEOUS | 0 refills | Status: DC
Start: 2022-06-16 — End: 2022-06-17

## 2022-06-17 ENCOUNTER — Other Ambulatory Visit: Payer: Self-pay | Admitting: Family Medicine

## 2022-06-17 ENCOUNTER — Telehealth: Payer: Self-pay | Admitting: Family Medicine

## 2022-06-17 NOTE — Telephone Encounter (Signed)
Pt sent mychart message saying she is trying to get pregnant. I told her weight loss medication is not safe during pregnancy and have told her not to start taking the medication. I have cancelled the order in epic.

## 2022-06-18 ENCOUNTER — Other Ambulatory Visit: Payer: Self-pay | Admitting: Family Medicine

## 2022-06-18 DIAGNOSIS — Z319 Encounter for procreative management, unspecified: Secondary | ICD-10-CM

## 2022-06-29 ENCOUNTER — Ambulatory Visit: Payer: BC Managed Care – PPO | Admitting: Family Medicine

## 2022-06-29 ENCOUNTER — Encounter: Payer: Self-pay | Admitting: Family Medicine

## 2022-06-29 VITALS — BP 141/99 | HR 110 | Temp 97.6°F | Ht 63.0 in | Wt 202.8 lb

## 2022-06-29 DIAGNOSIS — I10 Essential (primary) hypertension: Secondary | ICD-10-CM | POA: Diagnosis not present

## 2022-06-29 DIAGNOSIS — Z6836 Body mass index (BMI) 36.0-36.9, adult: Secondary | ICD-10-CM

## 2022-06-29 NOTE — Assessment & Plan Note (Signed)
BP elevated today. Repeat is 144/91. Pt is still hesitant to start BP medications. She is trying to conceive. We discussed that the lisinopril would not be an option for her -She will continue diet and exercise modification.  She will also bring her father's blood pressure cuff at next visit so we can see if the 2 correlate.

## 2022-06-29 NOTE — Progress Notes (Signed)
Established patient visit   Patient: Tanya Yates   DOB: 09-12-1995   27 y.o. Female  MRN: [id number] Visit Date: 06/29/2022  Today's healthcare provider: Charlton Amor, DO   Chief Complaint  Patient presents with   Follow-up    Pt hre for f/u on Bp, pt stated she is not taking the lisinopril due to making lifestyle modification changes to eating better and working out     SUBJECTIVE    Chief Complaint  Patient presents with   Follow-up    Pt hre for f/u on Bp, pt stated she is not taking the lisinopril due to making lifestyle modification changes to eating better and working out    HPI  PT presents for HTN follow up. Lisinopril 10mg  was recommended but she wanted to do lifestyle treatment first. Today, she is here today to follow up. She is currently trying to conceive.  Has lost 4 pounds with diet and exercise.  She does check her blood pressure at home with her father's blood pressure cuff.  Blood pressures are variety of ranges 1 reading is 131/104 and others are 128/82.  She does have 1 reading of 103/85.  Review of Systems  Constitutional:  Negative for activity change, fatigue and fever.  Respiratory:  Negative for cough and shortness of breath.   Cardiovascular:  Negative for chest pain.  Gastrointestinal:  Negative for abdominal pain.  Genitourinary:  Negative for difficulty urinating.       No outpatient medications have been marked as taking for the 06/29/22 encounter (Office Visit) with Charlton Amor, DO.    OBJECTIVE    BP (!) 141/99   Pulse (!) 110   Temp 97.6 F (36.4 C) (Oral)   Ht 5\' 3"  (1.6 m)   Wt 202 lb 12 oz (92 kg)   LMP 06/12/2022 (Exact Date)   SpO2 100%   BMI 35.92 kg/m   Physical Exam Vitals and nursing note reviewed.  Constitutional:      General: She is not in acute distress.    Appearance: Normal appearance.  HENT:     Head: Normocephalic and atraumatic.     Right Ear: External ear normal.     Left Ear:  External ear normal.     Nose: Nose normal.  Eyes:     Conjunctiva/sclera: Conjunctivae normal.  Cardiovascular:     Rate and Rhythm: Normal rate and regular rhythm.  Pulmonary:     Effort: Pulmonary effort is normal.     Breath sounds: Normal breath sounds.  Neurological:     General: No focal deficit present.     Mental Status: She is alert and oriented to person, place, and time.  Psychiatric:        Mood and Affect: Mood normal.        Behavior: Behavior normal.        Thought Content: Thought content normal.        Judgment: Judgment normal.       ASSESSMENT & PLAN    Problem List Items Addressed This Visit       Cardiovascular and Mediastinum   Primary hypertension - Primary    BP elevated today. Repeat is 144/91. Pt is still hesitant to start BP medications. She is trying to conceive. We discussed that the lisinopril would not be an option for her -She will continue diet and exercise modification.  She will also bring her father's blood pressure cuff at next visit so we  can see if the 2 correlate.        Other   Class 2 severe obesity due to excess calories with serious comorbidity and body mass index (BMI) of 36.0 to 36.9 in adult [facility])    Has lost 4 pounds in 2 weeks with diet and exercise changes.  I encouraged her to continue to do this.  Continue to monitor her weight.      Has an apt with ob on may 14th   Return in about 20 days (around 07/19/2022) for htn check.      No orders of the defined types were placed in this encounter.   No orders of the defined types were placed in this encounter.    Charlton Amor, DO  [facility] Primary Care & Sports Medicine at [facility] [phone] (phone) [phone] (fax)  [facility]

## 2022-06-29 NOTE — Assessment & Plan Note (Signed)
Has lost 4 pounds in 2 weeks with diet and exercise changes.  I encouraged her to continue to do this.  Continue to monitor her weight.

## 2022-07-13 ENCOUNTER — Ambulatory Visit: Payer: BC Managed Care – PPO | Admitting: Obstetrics and Gynecology

## 2022-07-13 ENCOUNTER — Encounter: Payer: Self-pay | Admitting: Obstetrics and Gynecology

## 2022-07-13 VITALS — BP 145/95 | HR 92 | Resp 16 | Ht 63.0 in | Wt 203.0 lb

## 2022-07-13 DIAGNOSIS — Z3169 Encounter for other general counseling and advice on procreation: Secondary | ICD-10-CM | POA: Diagnosis not present

## 2022-07-13 DIAGNOSIS — I10 Essential (primary) hypertension: Secondary | ICD-10-CM

## 2022-07-13 NOTE — Progress Notes (Signed)
Last pap smear @ [facility]

## 2022-07-13 NOTE — Progress Notes (Signed)
GYNECOLOGY ANNUAL PREVENTATIVE CARE ENCOUNTER NOTE  History:     Tanya Yates is a 27 y.o. G0P0000 female here for preconception counseling. Her and her partner have been trying inconsistently for about 1 year. She was seen by her PCP and diagnoses with pre diabetes, HTN and obesity. Her PCP recommended she start [facility] prior to conception. Tanya Yates would like counseling from an OB standpoint.   She has regular menstrual cycles monthly. No history of PCOS Father with late onset HTN No diabetes in family history.  She is very interested in losing weight prior to pregnancy and would like to avoid HTN management unless weight loss and lifestyle measures do not improve BP readings.   Tanya Yates is present today with her partner. He has not undergone a semen analysis and would be interested in doing this if needed. He is concerned about Tanya Yates's health history and is motivated to help her prior to pregnancy.   Gynecologic History Patient's last menstrual period was 07/06/2022 (exact date). Contraception: none   Obstetric History OB History  Gravida Para Term Preterm AB Living  0 0 0 0 0 0  SAB IAB Ectopic Multiple Live Births  0 0 0 0 0    Past Medical History:  Diagnosis Date   Anemia    Hypertension    Vitamin D deficiency     No past surgical history on file.  No current outpatient medications on file prior to visit.   No current facility-administered medications on file prior to visit.    No Known Allergies  Social History:  reports that she has never smoked. She has never been exposed to tobacco smoke. She has never used smokeless tobacco. She reports that she does not currently use alcohol. She reports that she does not use drugs.  Family History  Problem Relation Age of Onset   Healthy Mother    Healthy Father     The following portions of the patient's history were reviewed and updated as appropriate: allergies, current medications, past family history,  past medical history, past social history, past surgical history and problem list.  Review of Systems Pertinent items noted in HPI and remainder of comprehensive ROS otherwise negative.  Physical Exam:  BP (!) 145/95   Pulse 92   Resp 16   Ht 5\' 3"  (1.6 m)   Wt 203 lb (92.1 kg)   LMP 07/06/2022 (Exact Date)   BMI 35.96 kg/m  CONSTITUTIONAL: Well-developed, well-nourished female in no acute distress.  HENT:  Normocephalic EYES: Conjunctivae and EOM are normal. NECK: Normal range of motion, supple, no masses.  SKIN: Skin is warm and dry. No rash noted. Not diaphoretic. No erythema. No pallor. MUSCULOSKELETAL: Normal range of motion.    Assessment and Plan:    1. Primary hypertension  She would like to avoid BP medication at this time and focus on life style modifications She will check her BP at home and keep a log to review.  She will bring her BP cuff to the office of PCP office to calibrate.  She is aware of the recommendation of BASA in pregnancy for preeclampsia prevention.   2. Encounter for preconception consultation  - She Plans to start [facility] for weight loss management for 3-6 months. Her PCP is arranging and managing this. Even a 10% weight loss can aide significantly in conception.  -She is aware that she will need to stop Wegovi prior to active fertility treatments.  - We Will recheck A1c in 3  months prior to conception.   - she Will start taking multivitamins with folic acid supplement, avoid teratogens, use ovulation tracker apps and ovulation prediction kits as needed. However, if they are having troubles with conception after six months of trying, further evaluation with HSG, semen analysis or referral to [facility] specialist would be recommended. Optimization of other health issues recommended.    Follow up in 3 months to review weight loss progression and BP log.    Tanya Carbon, FNP [facility] [employer], [facility]

## 2022-07-14 ENCOUNTER — Ambulatory Visit: Payer: BC Managed Care – PPO | Admitting: Family Medicine

## 2022-07-16 ENCOUNTER — Ambulatory Visit: Payer: BC Managed Care – PPO | Admitting: Family Medicine

## 2022-07-16 ENCOUNTER — Encounter: Payer: Self-pay | Admitting: Family Medicine

## 2022-07-16 VITALS — BP 129/92 | HR 90 | Ht 63.0 in | Wt 199.0 lb

## 2022-07-16 DIAGNOSIS — Z6836 Body mass index (BMI) 36.0-36.9, adult: Secondary | ICD-10-CM

## 2022-07-16 DIAGNOSIS — I10 Essential (primary) hypertension: Secondary | ICD-10-CM | POA: Diagnosis not present

## 2022-07-16 DIAGNOSIS — E66812 Obesity, class 2: Secondary | ICD-10-CM

## 2022-07-16 MED ORDER — WEGOVY 0.25 MG/0.5ML ~~LOC~~ SOAJ: 0.2500 mg | SUBCUTANEOUS | 0 refills | Status: DC

## 2022-07-16 MED ORDER — LISINOPRIL 10 MG PO TABS: 10.0000 mg | ORAL_TABLET | Freq: Every day | ORAL | 3 refills | Status: DC

## 2022-07-16 NOTE — Assessment & Plan Note (Signed)
-   have gone ahead and started pt on wegovy  - she is not planning to get pregnant at this time. We discussed getting her weight under control over the next 3-6 months and then will discuss conception

## 2022-07-16 NOTE — Assessment & Plan Note (Signed)
-   started pt on lisinopril 10mg   - follow up in two weeks for recheck  - will also need bmp at that time to monitor K level

## 2022-07-16 NOTE — Progress Notes (Signed)
Established patient visit   Patient: Tanya Yates   DOB: [birth year]   27 y.o. Female  MRN: [id number] Visit Date: 07/16/2022  Today's healthcare provider: Charlton Amor, DO   Chief Complaint  Patient presents with   Follow-up    Bp/ check    SUBJECTIVE    Chief Complaint  Patient presents with   Follow-up    Bp/ check   HPI  Pt presents for follow up. BP was elevated today. She is also open to wegovy.  She has been watching what she is eating and exercising. She has lost about 6lbs since last visit.   She is not trying to get pregnant right now.   Review of Systems  Constitutional:  Negative for activity change, fatigue and fever.  Respiratory:  Negative for cough and shortness of breath.   Cardiovascular:  Negative for chest pain.  Gastrointestinal:  Negative for abdominal pain.  Genitourinary:  Negative for difficulty urinating.       Current Meds  Medication Sig   lisinopril (ZESTRIL) 10 MG tablet Take 1 tablet (10 mg total) by mouth daily.   WEGOVY 0.25 MG/0.5ML SOAJ Inject 0.25 mg into the skin once a week. Use this dose for 1 month (4 shots) and then increase to next higher dose.    OBJECTIVE    BP (!) 129/92   Pulse 90   Ht 5\' 3"  (1.6 m)   Wt 199 lb (90.3 kg)   LMP 07/06/2022 (Exact Date)   SpO2 99%   BMI 35.25 kg/m   Physical Exam Vitals and nursing note reviewed.  Constitutional:      General: She is not in acute distress.    Appearance: Normal appearance.  HENT:     Head: Normocephalic and atraumatic.     Right Ear: External ear normal.     Left Ear: External ear normal.     Nose: Nose normal.  Eyes:     Conjunctiva/sclera: Conjunctivae normal.  Cardiovascular:     Rate and Rhythm: Normal rate and regular rhythm.  Pulmonary:     Effort: Pulmonary effort is normal.     Breath sounds: Normal breath sounds.  Neurological:     General: No focal deficit present.     Mental Status: She is alert and oriented to person,  place, and time.  Psychiatric:        Mood and Affect: Mood normal.        Behavior: Behavior normal.        Thought Content: Thought content normal.        Judgment: Judgment normal.        ASSESSMENT & PLAN    Problem List Items Addressed This Visit       Cardiovascular and Mediastinum   Primary hypertension    - started pt on lisinopril 10mg   - follow up in two weeks for recheck  - will also need bmp at that time to monitor K level      Relevant Medications   lisinopril (ZESTRIL) 10 MG tablet     Other   Class 2 severe obesity due to excess calories with serious comorbidity and body mass index (BMI) of 36.0 to 36.9 in adult [facility]) - Primary    - have gone ahead and started pt on wegovy  - she is not planning to get pregnant at this time. We discussed getting her weight under control over the next 3-6 months and then will discuss conception  Relevant Medications   WEGOVY 0.25 MG/0.5ML SOAJ    Return in about 2 weeks (around 07/30/2022) for HTN.      Meds ordered this encounter  Medications   WEGOVY 0.25 MG/0.5ML SOAJ    Sig: Inject 0.25 mg into the skin once a week. Use this dose for 1 month (4 shots) and then increase to next higher dose.    Dispense:  2 mL    Refill:  0   lisinopril (ZESTRIL) 10 MG tablet    Sig: Take 1 tablet (10 mg total) by mouth daily.    Dispense:  30 tablet    Refill:  3    No orders of the defined types were placed in this encounter.    Charlton Amor, DO  [facility] Primary Care & Sports Medicine at [facility] [phone] (phone) [phone] (fax)  [facility]

## 2022-07-18 ENCOUNTER — Encounter: Payer: Self-pay | Admitting: Family Medicine

## 2022-07-20 ENCOUNTER — Encounter: Payer: Self-pay | Admitting: Family Medicine

## 2022-07-29 ENCOUNTER — Telehealth: Payer: Self-pay

## 2022-07-29 NOTE — Telephone Encounter (Signed)
Initiated Prior authorization ZOX:WRUEAV 0.25MG /0.5ML auto-injectors  Via: Covermymeds Case/Key:[id number] Status: Pending as of 07/29/22 Reason: Notified Pt via: Mychart

## 2022-07-30 ENCOUNTER — Encounter: Payer: Self-pay | Admitting: Family Medicine

## 2022-07-30 ENCOUNTER — Ambulatory Visit: Payer: BC Managed Care – PPO | Admitting: Family Medicine

## 2022-07-30 VITALS — BP 125/83 | HR 99 | Ht 63.0 in | Wt 199.0 lb

## 2022-07-30 DIAGNOSIS — Z6836 Body mass index (BMI) 36.0-36.9, adult: Secondary | ICD-10-CM | POA: Diagnosis not present

## 2022-07-30 DIAGNOSIS — I10 Essential (primary) hypertension: Secondary | ICD-10-CM | POA: Diagnosis not present

## 2022-07-30 MED ORDER — HYDROCHLOROTHIAZIDE 12.5 MG PO TABS: 12.5000 mg | ORAL_TABLET | Freq: Every day | ORAL | 3 refills | Status: DC

## 2022-07-30 NOTE — Assessment & Plan Note (Signed)
-   believe patient is experiencing side effects from lisinopril will have patient stop lisinopril and start hctz  - follow up in 2 weeks

## 2022-07-30 NOTE — Progress Notes (Signed)
Established patient visit   Patient: Tanya Yates   DOB: 1995/07/07   27 y.o. Female  MRN: [id number] Visit Date: 07/30/2022  Today's healthcare provider: Charlton Amor, DO   Chief Complaint  Patient presents with   Hypertension    SUBJECTIVE    Chief Complaint  Patient presents with   Hypertension   Hypertension Pertinent negatives include no chest pain or shortness of breath.    Pt presents for HTN follow up. She is on lisinopril 10mg . She says she gets shortness of breath for after taking the medication.   Review of Systems  Constitutional:  Negative for activity change, fatigue and fever.  Respiratory:  Negative for cough and shortness of breath.   Cardiovascular:  Negative for chest pain.  Gastrointestinal:  Negative for abdominal pain.  Genitourinary:  Negative for difficulty urinating.       Current Meds  Medication Sig   hydrochlorothiazide (HYDRODIURIL) 12.5 MG tablet Take 1 tablet (12.5 mg total) by mouth daily.   WEGOVY 0.25 MG/0.5ML SOAJ Inject 0.25 mg into the skin once a week. Use this dose for 1 month (4 shots) and then increase to next higher dose.   [DISCONTINUED] lisinopril (ZESTRIL) 10 MG tablet Take 1 tablet (10 mg total) by mouth daily.    OBJECTIVE    BP 125/83   Pulse 99   Ht 5\' 3"  (1.6 m)   Wt 199 lb (90.3 kg)   LMP 07/06/2022 (Exact Date)   SpO2 100%   BMI 35.25 kg/m   Physical Exam Vitals and nursing note reviewed.  Constitutional:      General: She is not in acute distress.    Appearance: Normal appearance.  HENT:     Head: Normocephalic and atraumatic.     Right Ear: External ear normal.     Left Ear: External ear normal.     Nose: Nose normal.  Eyes:     Conjunctiva/sclera: Conjunctivae normal.  Cardiovascular:     Rate and Rhythm: Normal rate and regular rhythm.  Pulmonary:     Effort: Pulmonary effort is normal.     Breath sounds: Normal breath sounds.  Neurological:     General: No focal  deficit present.     Mental Status: She is alert and oriented to person, place, and time.  Psychiatric:        Mood and Affect: Mood normal.        Behavior: Behavior normal.        Thought Content: Thought content normal.        Judgment: Judgment normal.       ASSESSMENT & PLAN    Problem List Items Addressed This Visit       Cardiovascular and Mediastinum   Primary hypertension - Primary    - believe patient is experiencing side effects from lisinopril will have patient stop lisinopril and start hctz  - follow up in 2 weeks      Relevant Medications   hydrochlorothiazide (HYDRODIURIL) 12.5 MG tablet     Other   Class 2 severe obesity due to excess calories with serious comorbidity and body mass index (BMI) of 36.0 to 36.9 in adult [facility])    - pt started wegovy this week and is tolerating it well  - will follow up in two weeks and we will increase to next dose and order other titration doses       Return in about 2 weeks (around 08/13/2022).  Meds ordered this encounter  Medications   hydrochlorothiazide (HYDRODIURIL) 12.5 MG tablet    Sig: Take 1 tablet (12.5 mg total) by mouth daily.    Dispense:  30 tablet    Refill:  3    Orders Placed This Encounter  Procedures   HM PAP SMEAR    This external order was created through the Results Console.     Charlton Amor, DO  [facility] Primary Care & Sports Medicine at [facility] [phone] (phone) [phone] (fax)  [facility]

## 2022-07-30 NOTE — Assessment & Plan Note (Signed)
-   pt started wegovy this week and is tolerating it well  - will follow up in two weeks and we will increase to next dose and order other titration doses

## 2022-08-08 ENCOUNTER — Encounter: Payer: Self-pay | Admitting: Family Medicine

## 2022-08-12 ENCOUNTER — Encounter: Payer: Self-pay | Admitting: Family Medicine

## 2022-08-12 ENCOUNTER — Ambulatory Visit: Payer: BC Managed Care – PPO | Admitting: Family Medicine

## 2022-08-12 VITALS — BP 131/89 | HR 85 | Resp 18 | Ht 63.0 in | Wt 201.2 lb

## 2022-08-12 DIAGNOSIS — Z6836 Body mass index (BMI) 36.0-36.9, adult: Secondary | ICD-10-CM

## 2022-08-12 DIAGNOSIS — I10 Essential (primary) hypertension: Secondary | ICD-10-CM | POA: Diagnosis not present

## 2022-08-12 MED ORDER — WEGOVY 1 MG/0.5ML ~~LOC~~ SOAJ: 1.0000 mg | SUBCUTANEOUS | 0 refills | Status: DC

## 2022-08-12 NOTE — Patient Instructions (Addendum)
[facility] 0.25mg  6/13 Wegovy 0.25mg  6/20  Then switch to wegovy 0.5mg   6/27 7/4 7/11 7/18  Then switch to wegovy 1mg   7/25 8/1 8/8 8/15

## 2022-08-12 NOTE — Assessment & Plan Note (Signed)
-   will increase wegovy doses to 0.5mg  and 1mg  Wegovy 0.25mg  6/13 Wegovy 0.25mg  6/20  Then switch to wegovy 0.5mg   6/27 7/4 7/11 7/18  Then switch to wegovy 1mg   7/25 8/1 8/8 8/15

## 2022-08-12 NOTE — Progress Notes (Signed)
Established patient visit   Patient: Tanya Yates   DOB: 1995/11/26   26 y.o. Female  MRN: [id number] Visit Date: 08/12/2022  Today's healthcare provider: Charlton Amor, DO   Chief Complaint  Patient presents with   Follow-up    Bp med check and wegovy    SUBJECTIVE    Chief Complaint  Patient presents with   Follow-up    Bp med check and wegovy   HPI Pt is tolerating wegovy well.  HTN - hctz 12.5mg  - BP elevated today at 152/95  Review of Systems  Constitutional:  Negative for activity change, fatigue and fever.  Respiratory:  Negative for cough and shortness of breath.   Cardiovascular:  Negative for chest pain.  Gastrointestinal:  Negative for abdominal pain.  Genitourinary:  Negative for difficulty urinating.       Current Meds  Medication Sig   hydrochlorothiazide (HYDRODIURIL) 12.5 MG tablet Take 1 tablet (12.5 mg total) by mouth daily.   WEGOVY 0.25 MG/0.5ML SOAJ Inject 0.25 mg into the skin once a week. Use this dose for 1 month (4 shots) and then increase to next higher dose.   WEGOVY 1 MG/0.5ML SOAJ Inject 1 mg into the skin once a week. Use this dose for 1 month (4 shots) and then increase to next higher dose.    OBJECTIVE    BP 131/89   Pulse 85   Resp 18   Ht 5\' 3"  (1.6 m)   Wt 201 lb 4 oz (91.3 kg)   LMP 07/06/2022 (Exact Date)   SpO2 99%   BMI 35.65 kg/m   Physical Exam Vitals and nursing note reviewed.  Constitutional:      General: She is not in acute distress.    Appearance: Normal appearance.  HENT:     Head: Normocephalic and atraumatic.     Right Ear: External ear normal.     Left Ear: External ear normal.     Nose: Nose normal.  Eyes:     Conjunctiva/sclera: Conjunctivae normal.  Cardiovascular:     Rate and Rhythm: Normal rate and regular rhythm.  Pulmonary:     Effort: Pulmonary effort is normal.     Breath sounds: Normal breath sounds.  Neurological:     General: No focal deficit present.      Mental Status: She is alert and oriented to person, place, and time.  Psychiatric:        Mood and Affect: Mood normal.        Behavior: Behavior normal.        Thought Content: Thought content normal.        Judgment: Judgment normal.          ASSESSMENT & PLAN    Problem List Items Addressed This Visit       Cardiovascular and Mediastinum   Primary hypertension - Primary    - repeat BP well controlled today  - continue hctz 12.5mg           Other   Class 2 severe obesity due to excess calories with serious comorbidity and body mass index (BMI) of 36.0 to 36.9 in adult [facility])    - will increase wegovy doses to 0.5mg  and 1mg  Wegovy 0.25mg  6/13 Wegovy 0.25mg  6/20  Then switch to wegovy 0.5mg   6/27 7/4 7/11 7/18  Then switch to wegovy 1mg   7/25 8/1 8/8 8/15      Relevant Medications   WEGOVY 1 MG/0.5ML SOAJ  Return in about 2 months (around 10/11/2022).      Meds ordered this encounter  Medications   WEGOVY 1 MG/0.5ML SOAJ    Sig: Inject 1 mg into the skin once a week. Use this dose for 1 month (4 shots) and then increase to next higher dose.    Dispense:  2 mL    Refill:  0    No orders of the defined types were placed in this encounter.    Charlton Amor, DO  [facility] Primary Care & Sports Medicine at [facility] [phone] (phone) [phone] (fax)  [facility]

## 2022-08-12 NOTE — Assessment & Plan Note (Signed)
-   repeat BP well controlled today  - continue hctz 12.5mg

## 2022-08-13 ENCOUNTER — Ambulatory Visit: Payer: BC Managed Care – PPO | Admitting: Family Medicine

## 2022-09-09 ENCOUNTER — Encounter: Payer: Self-pay | Admitting: Family Medicine

## 2022-09-18 IMAGING — CT CT ABD-PELV W/ CM
2 of 4 series · 15 of 46 positions shown, 17 images · IV contrast (APPLIED)
Comparison: None.

CLINICAL DATA: Left lower quadrant abdominal pain with cramping.

EXAM:
CT ABDOMEN AND PELVIS WITH CONTRAST
TECHNIQUE: Multidetector CT imaging of the abdomen and pelvis was performed
using the standard protocol following bolus administration of
intravenous contrast.

[Series 2: axial st · axial · 0.67mm/px · z∈[-505,-70]mm · 12 of 99 slices shown, 14 images]
[im 8/99  soft-tissue]
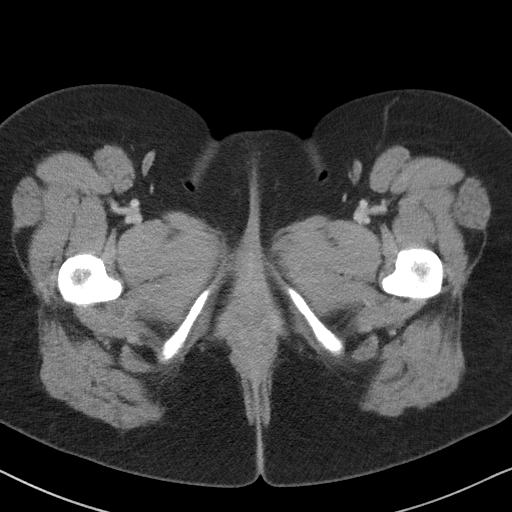
[im 8/99  bone]
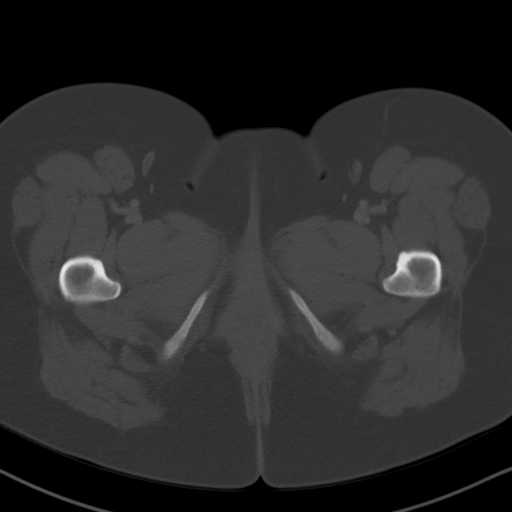
[im 16/99  soft-tissue]
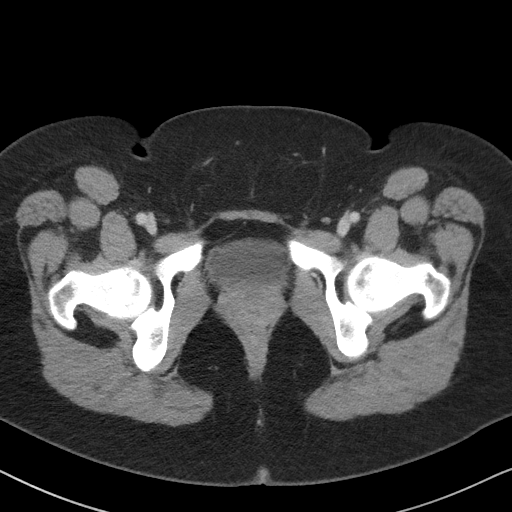
[im 24/99  soft-tissue]
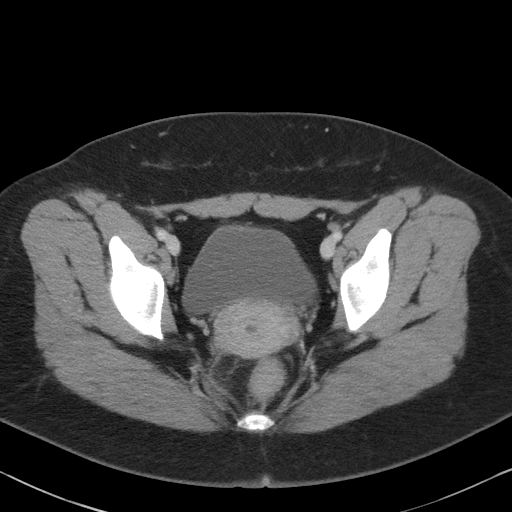
[im 32/99  soft-tissue]
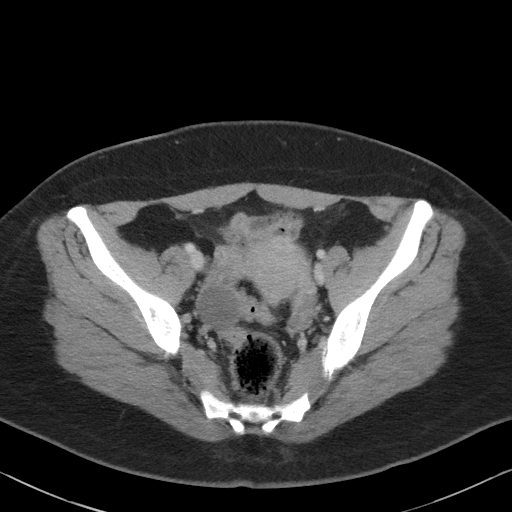
[im 40/99  soft-tissue]
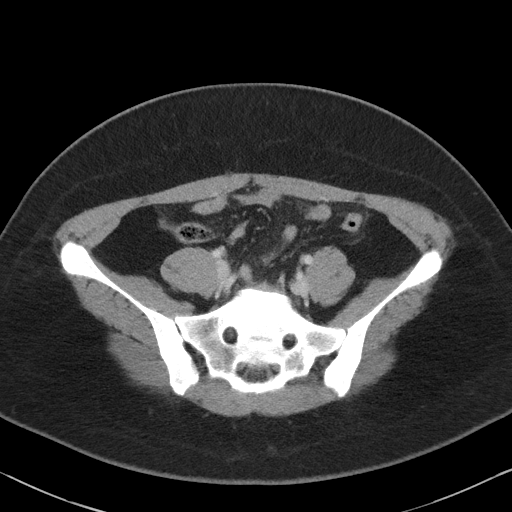
[im 48/99  soft-tissue]
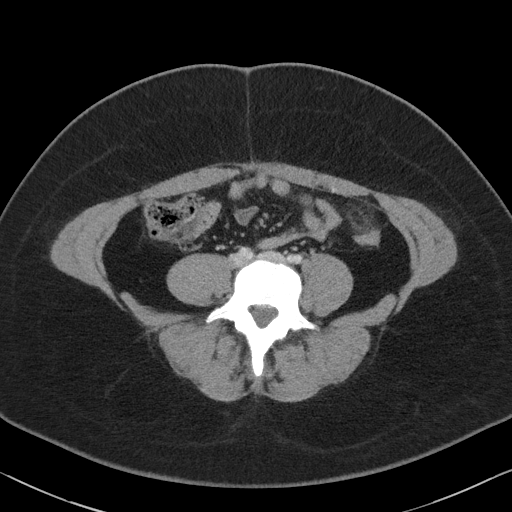
[im 55/99  soft-tissue]
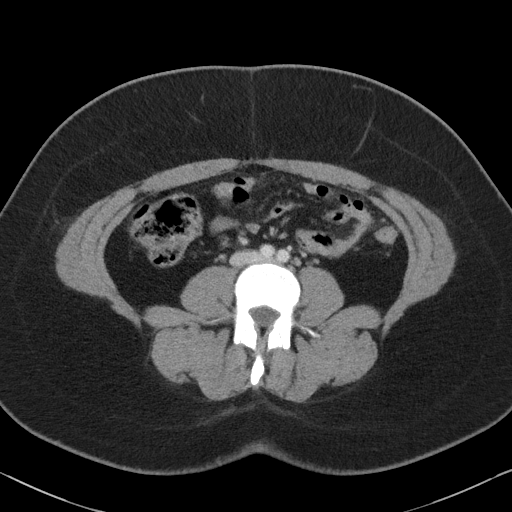
[im 63/99  soft-tissue]
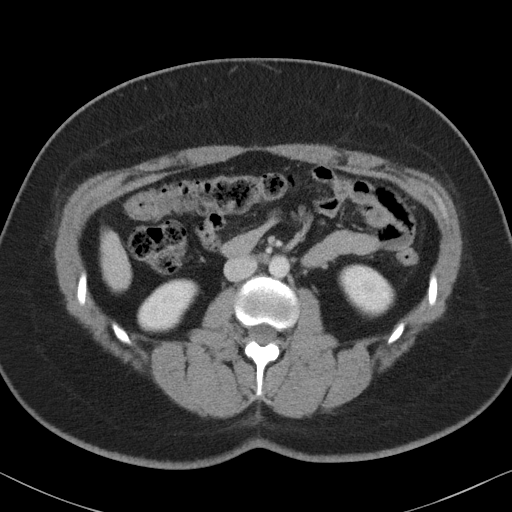
[im 71/99  soft-tissue]
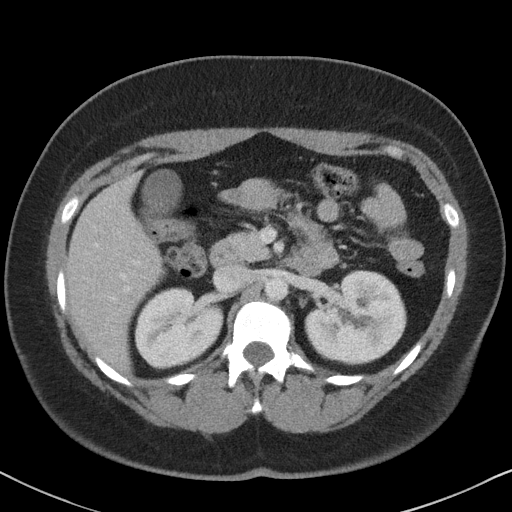
[im 71/99  bone]
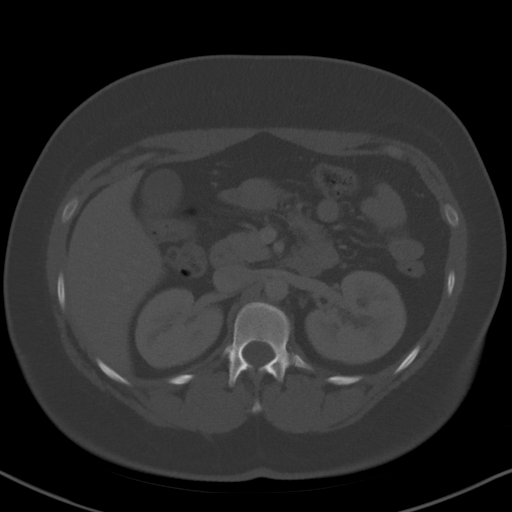
[im 79/99  soft-tissue]
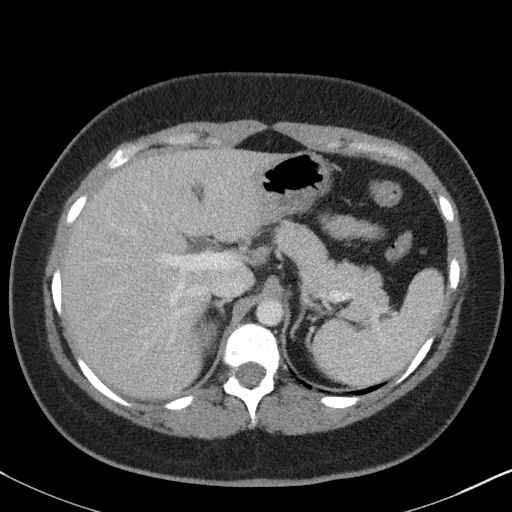
[im 87/99  soft-tissue]
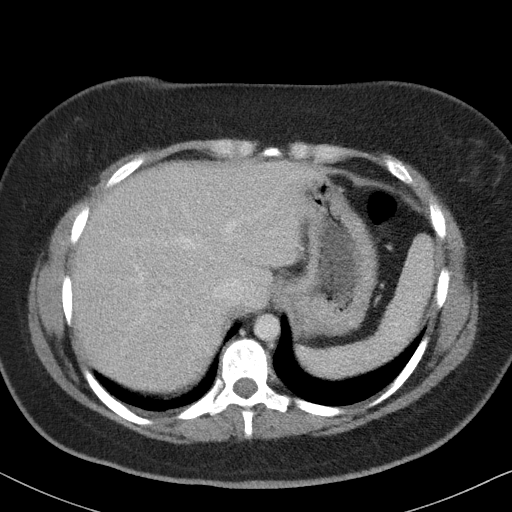
[im 95/99  soft-tissue]
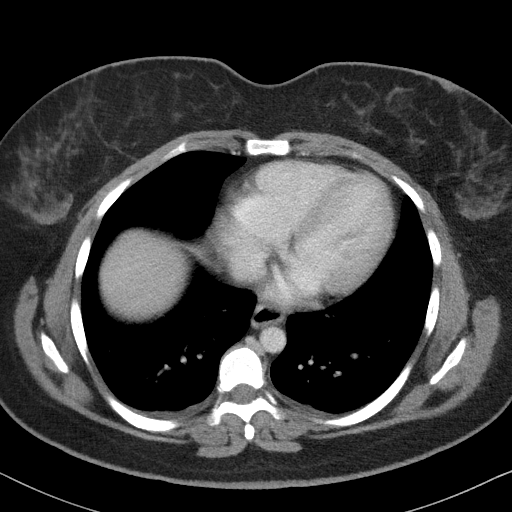

[Series 5: coronal st · coronal · 0.65mm/px · 3 of 84 slices shown]
[im 28/84  soft-tissue]
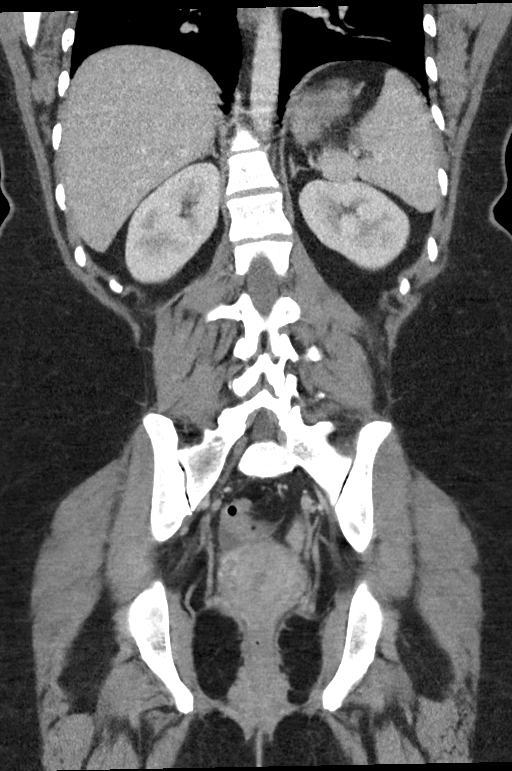
[im 37/84  soft-tissue]
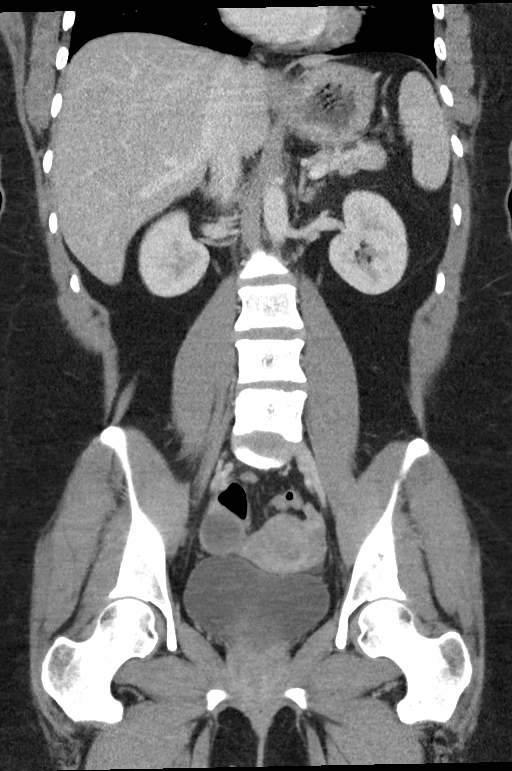
[im 47/84  soft-tissue]
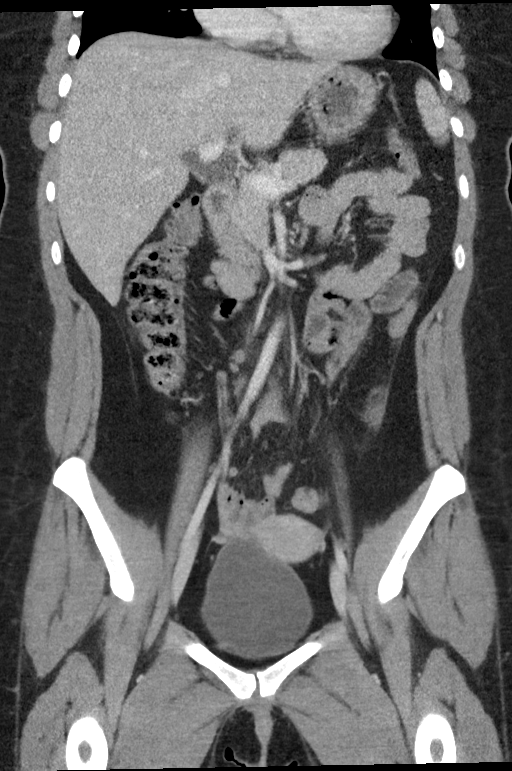

[15 of 46 positions shown; findings below may reference images not displayed]

RADIATION DOSE REDUCTION: This exam was performed according to the
departmental dose-optimization program which includes automated
exposure control, adjustment of the mA and/or kV according to
patient size and/or use of iterative reconstruction technique.

CONTRAST:  100mL OMNIPAQUE IOHEXOL 300 MG/ML  SOLN
FINDINGS: Lower chest: Unremarkable

Hepatobiliary: No suspicious focal abnormality within the liver
parenchyma. There is no evidence for gallstones, gallbladder wall
thickening, or pericholecystic fluid. No intrahepatic or
extrahepatic biliary dilation.

Pancreas: No focal mass lesion. No dilatation of the main duct. No
intraparenchymal cyst. No peripancreatic edema.

Spleen: No splenomegaly. No focal mass lesion.

Adrenals/Urinary Tract: No adrenal nodule or mass. Kidneys
unremarkable. No evidence for hydroureter. The urinary bladder
appears normal for the degree of distention.

Stomach/Bowel: Stomach is unremarkable. No gastric wall thickening.
No evidence of outlet obstruction. Duodenum is normally positioned
as is the ligament of Treitz. No small bowel wall thickening. No
small bowel dilatation. The terminal ileum is normal. The appendix
is normal. No gross colonic mass. No colonic wall thickening. Oval
ring of edema/inflammation identified immediately adjacent to the
distal descending colon (well demonstrated axial image 54 series 2)
is characteristic for epiploic appendagitis.

Vascular/Lymphatic: No abdominal aortic aneurysm. There is no
gastrohepatic or hepatoduodenal ligament lymphadenopathy. No
retroperitoneal or mesenteric lymphadenopathy. No pelvic sidewall
lymphadenopathy.

Reproductive: The uterus is unremarkable. 3.1 cm benign appearing
cyst or dominant follicle noted right ovary. Left ovary
unremarkable.

Other: No intraperitoneal free fluid.

Musculoskeletal: No worrisome lytic or sclerotic osseous
abnormality.
IMPRESSION: 1. Oval ring of edema/inflammation immediately adjacent to the
distal descending colon is characteristic for epiploic appendagitis.
2. 3.1 cm benign appearing cyst or dominant follicle noted right
ovary. No follow-up imaging recommended. Note: This recommendation
does not apply to premenarchal patients and to those with increased
risk (genetic, family history, elevated tumor markers or other
high-risk factors) of ovarian cancer. Reference: JACR [DATE];

## 2022-09-18 IMAGING — DX DG ABDOMEN 1V
2 series · 2 of 2 positions shown · non-contrast
Comparison: None.

CLINICAL DATA: Abdominal pain and cramping for 4 days. Bloating.
Constipation.

EXAM:
ABDOMEN - 1 VIEW

[abdomen kub (1 of 2)]
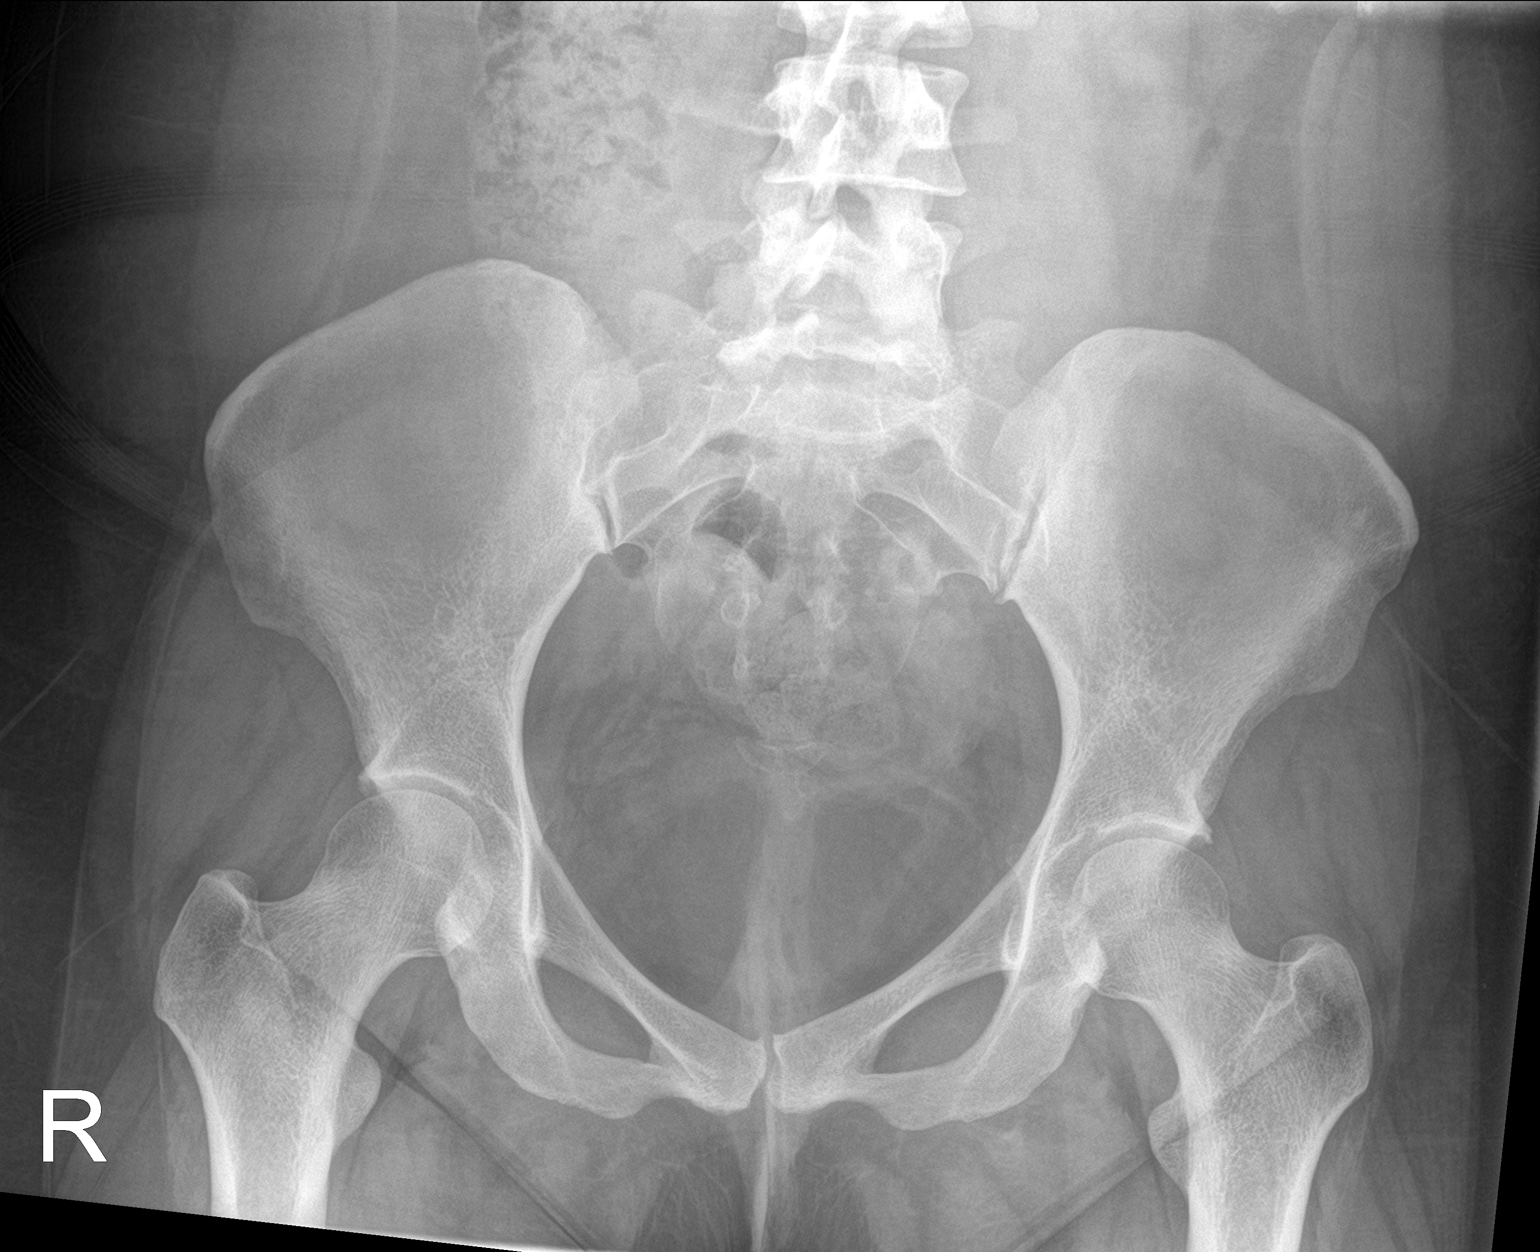

[abdomen kub (2 of 2)]
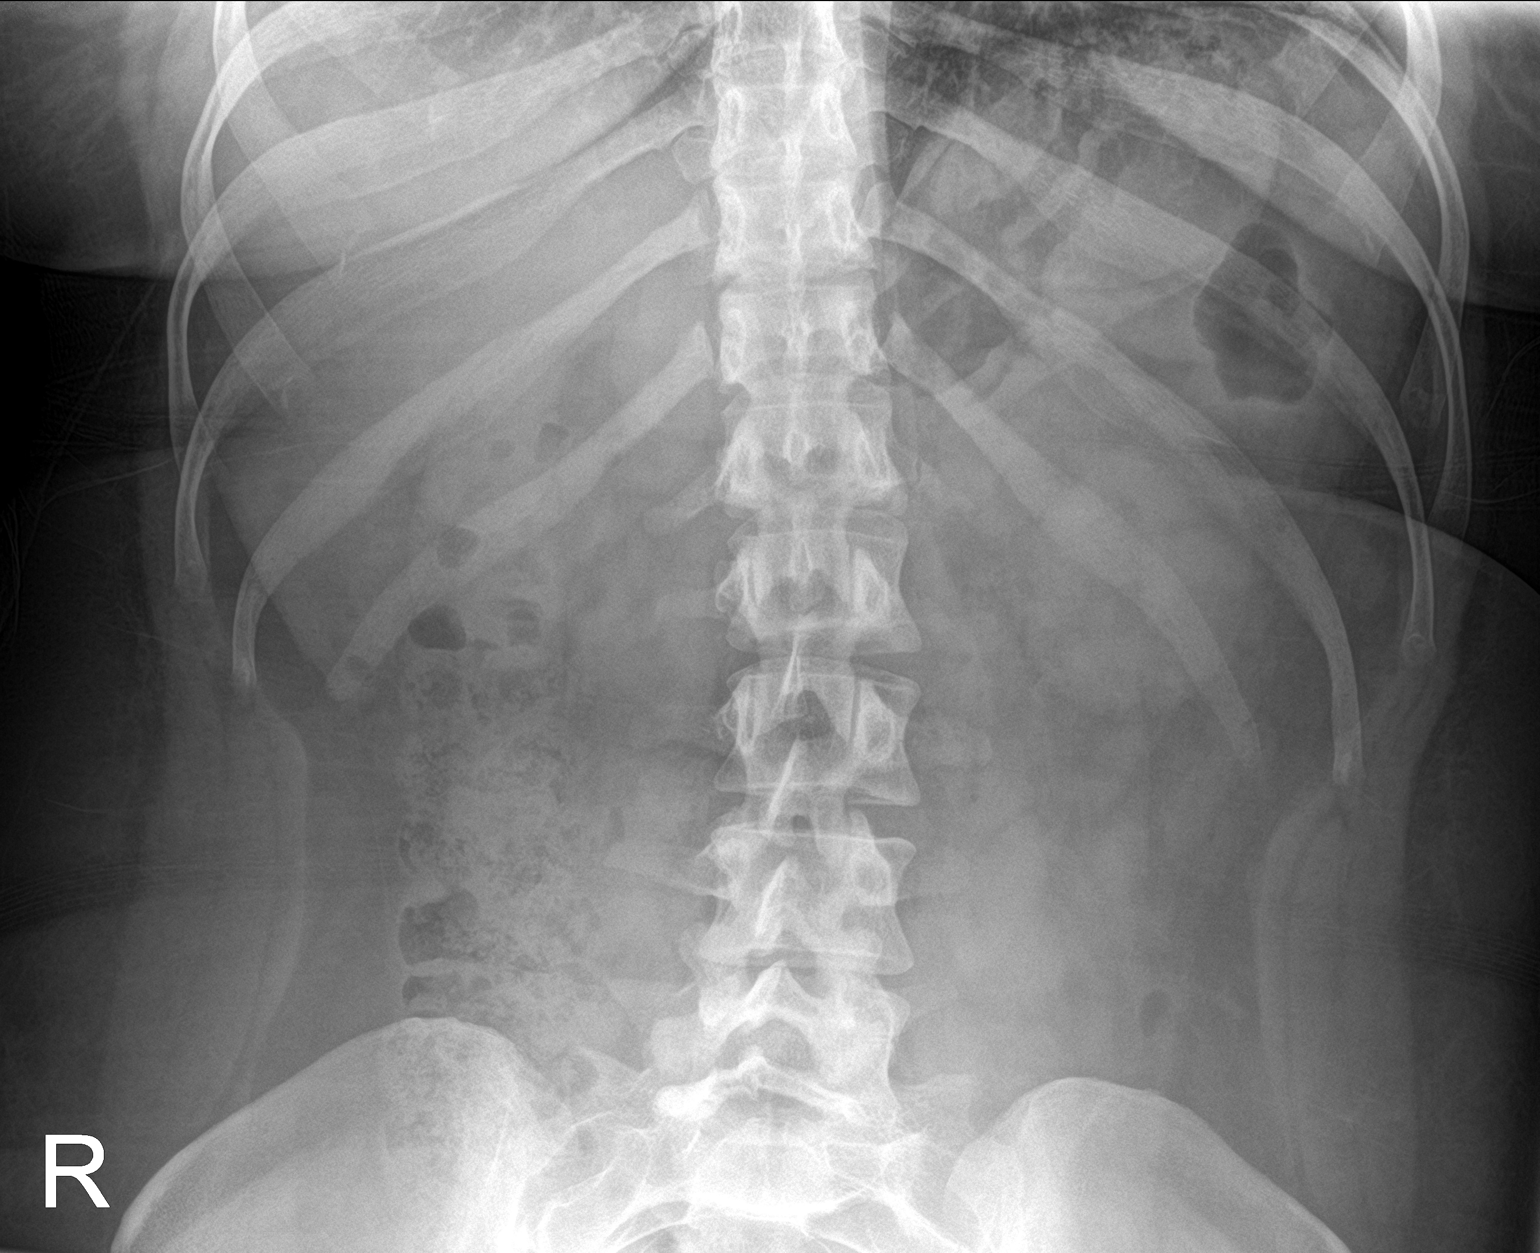

[2 of 2 positions shown; findings below may reference images not displayed]

FINDINGS: There is some formed stool in the ascending colon but overall the
stool burden in the abdomen is relatively low and within normal
limits. No dilated bowel is identified, although most of the small
bowel is gasless. No significant abnormal calcifications are
identified. Mild levoconvex lumbar rotary scoliosis.
IMPRESSION: 1. Unremarkable bowel gas pattern.
2. Mild levoconvex lumbar rotary scoliosis.

## 2022-09-29 ENCOUNTER — Other Ambulatory Visit: Payer: Self-pay | Admitting: Family Medicine

## 2022-09-29 MED ORDER — WEGOVY 1 MG/0.5ML ~~LOC~~ SOAJ: 1.0000 mg | SUBCUTANEOUS | 3 refills | Status: DC

## 2022-10-06 ENCOUNTER — Encounter: Payer: Self-pay | Admitting: Family Medicine

## 2022-10-12 ENCOUNTER — Ambulatory Visit: Payer: BC Managed Care – PPO | Admitting: Family Medicine

## 2022-10-12 ENCOUNTER — Encounter: Payer: Self-pay | Admitting: Family Medicine

## 2022-10-12 VITALS — BP 143/100 | HR 120 | Resp 20 | Ht 63.0 in | Wt 190.5 lb

## 2022-10-12 DIAGNOSIS — Z6836 Body mass index (BMI) 36.0-36.9, adult: Secondary | ICD-10-CM

## 2022-10-12 DIAGNOSIS — I10 Essential (primary) hypertension: Secondary | ICD-10-CM

## 2022-10-12 DIAGNOSIS — E559 Vitamin D deficiency, unspecified: Secondary | ICD-10-CM | POA: Diagnosis not present

## 2022-10-12 DIAGNOSIS — R7303 Prediabetes: Secondary | ICD-10-CM

## 2022-10-12 DIAGNOSIS — D509 Iron deficiency anemia, unspecified: Secondary | ICD-10-CM

## 2022-10-12 DIAGNOSIS — N946 Dysmenorrhea, unspecified: Secondary | ICD-10-CM

## 2022-10-12 MED ORDER — WEGOVY 1.7 MG/0.75ML ~~LOC~~ SOAJ: 1.7000 mg | SUBCUTANEOUS | 3 refills | Status: DC

## 2022-10-12 NOTE — Progress Notes (Unsigned)
     Established patient visit   Patient: Tanya Yates   DOB: 1995/05/10   26 y.o. Female  MRN: [id number] Visit Date: 10/12/2022  Today's healthcare provider: Charlton Amor, DO   No chief complaint on file.   SUBJECTIVE   No chief complaint on file.  HPI   Pt presents for follow up on wegovy. She is currently on wegovy 1mg . She has lost 11lbs.   Dysmenorrhea  -   Review of Systems     No outpatient medications have been marked as taking for the 10/12/22 encounter (Appointment) with Charlton Amor, DO.    OBJECTIVE    There were no vitals taken for this visit.  Physical Exam       ASSESSMENT & PLAN    Problem List Items Addressed This Visit   None   No follow-ups on file.      No orders of the defined types were placed in this encounter.   No orders of the defined types were placed in this encounter.    Charlton Amor, DO  [facility] Health Primary Care & Sports Medicine at [facility] [phone] (phone) [phone] (fax)  [facility]

## 2022-10-13 DIAGNOSIS — N946 Dysmenorrhea, unspecified: Secondary | ICD-10-CM | POA: Insufficient documentation

## 2022-10-13 DIAGNOSIS — R7303 Prediabetes: Secondary | ICD-10-CM | POA: Insufficient documentation

## 2022-10-13 MED ORDER — AMLODIPINE BESYLATE 5 MG PO TABS: 5.0000 mg | ORAL_TABLET | Freq: Every day | ORAL | 3 refills | Status: DC

## 2022-10-13 NOTE — Assessment & Plan Note (Signed)
-   increase wegovy to 1.7mg  for four weeks  - did some research and there is some data saying that wegovy can cause irregular or heavy menstrual periods

## 2022-10-13 NOTE — Assessment & Plan Note (Signed)
Pt has a hx of IDA and with recent heavy menstrual bleeding I would like to order iron studies and cbc. Wonder if this is the cause of her fatigue. She is currently not taking any iron supplements per her report. Recommended we start

## 2022-10-13 NOTE — Assessment & Plan Note (Signed)
-   pt also has a hx of vit d deficiency but has not been taking any supplementation. This could be contributing to fatigue level-will go ahead and order vit d level to ensure appropriate supplementation

## 2022-10-13 NOTE — Assessment & Plan Note (Signed)
-   add amlodipine 5mg  to current regimen for better htn control  - follow up in two weeks  - will get blood work today to check kidney function

## 2022-10-13 NOTE — Assessment & Plan Note (Signed)
-   pt worried about losing her job with the time off work she has had to take due to painful and heavy menstrual bleeding. Bleeding is to the point where she is unable to control with pads  - did discuss with patient I can fill out fmla paperwork for a few months to allow [place] time to work up and create a plan for treatment because she cannot be bleeding this much. She will talk with employer and let me know

## 2022-10-13 NOTE — Assessment & Plan Note (Signed)
-   ordering A1c today

## 2022-10-14 ENCOUNTER — Encounter: Payer: Self-pay | Admitting: Family Medicine

## 2022-10-22 ENCOUNTER — Encounter: Payer: Self-pay | Admitting: Family Medicine

## 2022-10-25 ENCOUNTER — Telehealth: Payer: Self-pay

## 2022-10-25 NOTE — Telephone Encounter (Signed)
Patient came into office to drop off FMLA paperwork, forms placed in Dr.Wach's box, thanks.

## 2022-10-26 ENCOUNTER — Other Ambulatory Visit: Payer: Self-pay | Admitting: Family Medicine

## 2022-11-08 ENCOUNTER — Encounter: Payer: Self-pay | Admitting: Obstetrics & Gynecology

## 2022-11-08 ENCOUNTER — Ambulatory Visit: Payer: BC Managed Care – PPO | Admitting: Obstetrics & Gynecology

## 2022-11-08 VITALS — BP 145/97 | HR 123 | Ht 63.0 in | Wt 180.0 lb

## 2022-11-08 DIAGNOSIS — N920 Excessive and frequent menstruation with regular cycle: Secondary | ICD-10-CM | POA: Diagnosis not present

## 2022-11-08 DIAGNOSIS — I10 Essential (primary) hypertension: Secondary | ICD-10-CM | POA: Diagnosis not present

## 2022-11-08 DIAGNOSIS — Z6836 Body mass index (BMI) 36.0-36.9, adult: Secondary | ICD-10-CM | POA: Diagnosis not present

## 2022-11-08 MED ORDER — TRANEXAMIC ACID 650 MG PO TABS: 1300.0000 mg | ORAL_TABLET | Freq: Three times a day (TID) | ORAL | 2 refills | Status: DC

## 2022-11-08 NOTE — Progress Notes (Signed)
   Subjective:    Patient ID: Tanya Yates, female    DOB: [birth year], 27 y.o.   MRN: [id number]  HPI  27 yo G0 female presents for heavy menses since going on Wegovy.  She has lost 2 pounds.  Bleeding is heavy for 4 days.  Clots on first day. Not much pain.  No problems bleeding at any other time in her life.  Has avoided hormonal contraception due to side effects and weight gain.     Review of Systems  Constitutional: Negative.   Respiratory: Negative.    Cardiovascular: Negative.   Gastrointestinal: Negative.   Genitourinary:  Positive for menstrual problem and vaginal bleeding. Negative for dyspareunia, pelvic pain and vaginal pain.       Objective:   Physical Exam Vitals reviewed.  Constitutional:      General: She is not in acute distress.    Appearance: She is well-developed.  HENT:     Head: Normocephalic and atraumatic.  Eyes:     Conjunctiva/sclera: Conjunctivae normal.  Cardiovascular:     Rate and Rhythm: Normal rate.  Pulmonary:     Effort: Pulmonary effort is normal.  Genitourinary:    Comments: Tanner V Vulva:  No lesion Vagina:  Pink, no lesions, no discharge, no blood Cervix:  No CMT Uterus:  Non tender, mobile, limited by habitus Right adnexa--non tender, no mass, limited by habitus Left adnexa--non tender, no mass, limited by habitus Skin:    General: Skin is warm and dry.  Neurological:     Mental Status: She is alert and oriented to person, place, and time.  Psychiatric:        Mood and Affect: Mood normal.    Vitals:   11/08/22 1420 11/08/22 1453  BP: (!) 155/104 (!) 145/97  Pulse: (!) 123   Weight: 180 lb (81.6 kg)   Height: 5\' 3"  (1.6 m)       Assessment & Plan:    Pelvic US complete Cbc and ferritin Semen analysis TXA during menstrual cycles to help with menorrhagia--no personal or family history of DVT/PE PCP managing HTN

## 2022-11-08 NOTE — Progress Notes (Signed)
PCP manages HTN

## 2022-11-09 ENCOUNTER — Ambulatory Visit: Payer: BC Managed Care – PPO | Admitting: Family Medicine

## 2022-11-09 ENCOUNTER — Encounter: Payer: Self-pay | Admitting: Family Medicine

## 2022-11-09 VITALS — BP 128/85 | HR 99 | Ht 63.0 in | Wt 184.0 lb

## 2022-11-09 DIAGNOSIS — L819 Disorder of pigmentation, unspecified: Secondary | ICD-10-CM | POA: Diagnosis not present

## 2022-11-09 DIAGNOSIS — F5101 Primary insomnia: Secondary | ICD-10-CM | POA: Diagnosis not present

## 2022-11-09 DIAGNOSIS — I10 Essential (primary) hypertension: Secondary | ICD-10-CM

## 2022-11-09 DIAGNOSIS — Z6836 Body mass index (BMI) 36.0-36.9, adult: Secondary | ICD-10-CM

## 2022-11-09 DIAGNOSIS — Z23 Encounter for immunization: Secondary | ICD-10-CM

## 2022-11-09 MED ORDER — CLOTRIMAZOLE 1 % EX CREA: 1.0000 | TOPICAL_CREAM | Freq: Two times a day (BID) | CUTANEOUS | 0 refills | Status: DC

## 2022-11-09 MED ORDER — TRAZODONE HCL 50 MG PO TABS: 25.0000 mg | ORAL_TABLET | Freq: Every evening | ORAL | 0 refills | Status: DC | PRN

## 2022-11-09 MED ORDER — TRIAMCINOLONE ACETONIDE 0.5 % EX CREA: 1.0000 | TOPICAL_CREAM | Freq: Two times a day (BID) | CUTANEOUS | 0 refills | Status: DC

## 2022-11-09 NOTE — Progress Notes (Signed)
Established patient visit   Patient: Tanya Yates   DOB: 1995/09/01   27 y.o. Female  MRN: [id number] Visit Date: 11/09/2022  Today's healthcare provider: Charlton Amor, DO   Chief Complaint  Patient presents with   Hypertension   Weight Check    SUBJECTIVE    Chief Complaint  Patient presents with   Hypertension   Weight Check   HPI  Pt here for follow up.   Obesity - on wegovy 1.7mg   - weight loss of lbs 17lbs since starting medication   Dysmenorrhea  - was seen by obgyn and waiting for TVUS and plan to use TXA   HTN - on amlodipine 5mg   - on hydrochlorothiazide 12.5mg  - blood pressure elevated to 133/96  Review of Systems  Constitutional:  Positive for fatigue. Negative for activity change and fever.  Respiratory:  Negative for cough and shortness of breath.   Cardiovascular:  Negative for chest pain.  Gastrointestinal:  Negative for abdominal pain.  Genitourinary:  Negative for difficulty urinating.       Current Meds  Medication Sig   amLODipine (NORVASC) 5 MG tablet Take 1 tablet (5 mg total) by mouth daily.   clotrimazole (LOTRIMIN) 1 % cream Apply 1 Application topically 2 (two) times daily.   hydrochlorothiazide (HYDRODIURIL) 12.5 MG tablet TAKE 1 TABLET BY MOUTH EVERY DAY   tranexamic acid (LYSTEDA) 650 MG TABS tablet Take 2 tablets (1,300 mg total) by mouth 3 (three) times daily. Take during menses for a maximum of five days   traZODone (DESYREL) 50 MG tablet Take 0.5-1 tablets (25-50 mg total) by mouth at bedtime as needed for sleep.   triamcinolone cream (KENALOG) 0.5 % Apply 1 Application topically 2 (two) times daily. To affected areas.   WEGOVY 1.7 MG/0.75ML SOAJ Inject 1.7 mg into the skin once a week. Use this dose for 1 month (4 shots) and then increase to next higher dose.    OBJECTIVE    BP 128/85   Pulse 99   Ht 5\' 3"  (1.6 m)   Wt 184 lb 0.6 oz (83.5 kg)   LMP 10/20/2022   SpO2 100%   BMI 32.60 kg/m    Physical Exam Vitals and nursing note reviewed.  Constitutional:      General: She is not in acute distress.    Appearance: Normal appearance.  HENT:     Head: Normocephalic and atraumatic.     Right Ear: External ear normal.     Left Ear: External ear normal.     Nose: Nose normal.  Eyes:     Conjunctiva/sclera: Conjunctivae normal.  Cardiovascular:     Rate and Rhythm: Normal rate.  Pulmonary:     Effort: Pulmonary effort is normal.  Neurological:     General: No focal deficit present.     Mental Status: She is alert and oriented to person, place, and time.  Psychiatric:        Mood and Affect: Mood normal.        Behavior: Behavior normal.        Thought Content: Thought content normal.        Judgment: Judgment normal.        ASSESSMENT & PLAN    Problem List Items Addressed This Visit       Cardiovascular and Mediastinum   Primary hypertension    - BP elevated on arrival, but normotensive on repeat. Will continue amlodipine 5mg  and hydrochlorothiazide 12.5mg   - pt  not having any chest pain, shortness of breath, blurry vision.        Other   Class 2 severe obesity due to excess calories with serious comorbidity and body mass index (BMI) of 36.0 to 36.9 in adult (HCC) - Primary    - BMI at 32 still, pt doing very well with 17lb weight loss, we will continue wegovy 1.7mg   - recommend continued diet and exercise  - had a discussion that I would like her bmi <30 before we consider stopping.      Primary insomnia    - starting pt on trazodone for sleep likely this is causing her fatigue. She says she does not fall asleep until 1 am      Relevant Medications   traZODone (DESYREL) 50 MG tablet   Discoloration of skin    Has some hyperpigmentation of skin between both breasts. Have given patient prescription for antifungal and steroid cream       Relevant Medications   clotrimazole (LOTRIMIN) 1 % cream   Other Visit Diagnoses     Encounter for  immunization       Relevant Orders   Flu vaccine trivalent PF, 6mos and older(Flulaval,Afluria,Fluarix,Fluzone) (Completed)      Pt still on fmla until we can figure out a plan for her menses. She is doing much better from a htn and weight standpoint and I do not believe she will need to be out of work much longer.   Return in about 2 months (around 01/09/2023).      Meds ordered this encounter  Medications   traZODone (DESYREL) 50 MG tablet    Sig: Take 0.5-1 tablets (25-50 mg total) by mouth at bedtime as needed for sleep.    Dispense:  30 tablet    Refill:  0   clotrimazole (LOTRIMIN) 1 % cream    Sig: Apply 1 Application topically 2 (two) times daily.    Dispense:  30 g    Refill:  0   triamcinolone cream (KENALOG) 0.5 %    Sig: Apply 1 Application topically 2 (two) times daily. To affected areas.    Dispense:  30 g    Refill:  0    Orders Placed This Encounter  Procedures   Flu vaccine trivalent PF, 6mos and older(Flulaval,Afluria,Fluarix,Fluzone)     Charlton Amor, DO  [facility] Primary Care & Sports Medicine at [facility] [phone] (phone) [phone] (fax)  [facility]

## 2022-11-09 NOTE — Assessment & Plan Note (Signed)
-   BP elevated on arrival, but normotensive on repeat. Will continue amlodipine 5mg  and hydrochlorothiazide 12.5mg   - pt not having any chest pain, shortness of breath, blurry vision.

## 2022-11-09 NOTE — Patient Instructions (Signed)
Any hair, skin, and nail supplement over the counter--make sure it has biotin in it

## 2022-11-09 NOTE — Assessment & Plan Note (Signed)
-   starting pt on trazodone for sleep likely this is causing her fatigue. She says she does not fall asleep until 1 am

## 2022-11-09 NOTE — Assessment & Plan Note (Addendum)
-   BMI at 32 still, pt doing very well with 17lb weight loss, we will continue wegovy 1.7mg   - recommend continued diet and exercise  - had a discussion that I would like her bmi <30 before we consider stopping.

## 2022-11-09 NOTE — Assessment & Plan Note (Signed)
Has some hyperpigmentation of skin between both breasts. Have given patient prescription for antifungal and steroid cream

## 2022-11-10 ENCOUNTER — Ambulatory Visit (INDEPENDENT_AMBULATORY_CARE_PROVIDER_SITE_OTHER): Payer: BC Managed Care – PPO

## 2022-11-10 DIAGNOSIS — N920 Excessive and frequent menstruation with regular cycle: Secondary | ICD-10-CM | POA: Diagnosis not present

## 2022-11-10 DIAGNOSIS — D251 Intramural leiomyoma of uterus: Secondary | ICD-10-CM | POA: Diagnosis not present

## 2022-11-11 ENCOUNTER — Encounter: Payer: Self-pay | Admitting: Family Medicine

## 2022-11-11 LAB — FERRITIN: Ferritin: 22 ng/mL (ref 15–150)

## 2022-11-11 LAB — CBC
Hematocrit: 39.1 % (ref 34.0–46.6)
Hemoglobin: 12.3 g/dL (ref 11.1–15.9)
MCH: 26 pg — ABNORMAL LOW (ref 26.6–33.0)
MCHC: 31.5 g/dL (ref 31.5–35.7)
MCV: 83 fL (ref 79–97)
Platelets: 324 10*3/uL (ref 150–450)
RBC: 4.73 x10E6/uL (ref 3.77–5.28)
RDW: 15 % (ref 11.7–15.4)
WBC: 6.2 10*3/uL (ref 3.4–10.8)

## 2022-11-16 ENCOUNTER — Encounter: Payer: Self-pay | Admitting: Obstetrics & Gynecology

## 2022-11-26 ENCOUNTER — Encounter: Payer: Self-pay | Admitting: *Deleted

## 2022-11-26 NOTE — Progress Notes (Signed)
Copy of partner's semen analysis scanned into chart a copy mailed to pt's address.  Sent to Dr Penne Lash for review.

## 2022-12-01 ENCOUNTER — Other Ambulatory Visit: Payer: Self-pay | Admitting: Family Medicine

## 2022-12-01 DIAGNOSIS — F5101 Primary insomnia: Secondary | ICD-10-CM

## 2022-12-06 ENCOUNTER — Encounter: Payer: Self-pay | Admitting: Family Medicine

## 2022-12-28 ENCOUNTER — Encounter: Payer: Self-pay | Admitting: Obstetrics & Gynecology

## 2022-12-28 DIAGNOSIS — N469 Male infertility, unspecified: Secondary | ICD-10-CM | POA: Insufficient documentation

## 2023-01-08 ENCOUNTER — Other Ambulatory Visit: Payer: Self-pay | Admitting: Family Medicine

## 2023-01-10 ENCOUNTER — Encounter: Payer: Self-pay | Admitting: Family Medicine

## 2023-01-10 ENCOUNTER — Ambulatory Visit: Payer: BC Managed Care – PPO | Admitting: Family Medicine

## 2023-01-10 VITALS — BP 129/88 | HR 89 | Ht 63.0 in | Wt 181.8 lb

## 2023-01-10 DIAGNOSIS — E66812 Obesity, class 2: Secondary | ICD-10-CM

## 2023-01-10 DIAGNOSIS — F5101 Primary insomnia: Secondary | ICD-10-CM

## 2023-01-10 DIAGNOSIS — Z6836 Body mass index (BMI) 36.0-36.9, adult: Secondary | ICD-10-CM

## 2023-01-10 DIAGNOSIS — I10 Essential (primary) hypertension: Secondary | ICD-10-CM

## 2023-01-10 MED ORDER — WEGOVY 2.4 MG/0.75ML ~~LOC~~ SOAJ: 2.4000 mg | SUBCUTANEOUS | 4 refills | Status: DC

## 2023-01-10 NOTE — Assessment & Plan Note (Signed)
Pt doing well, would like to increase dosage, sending in 2.4mg   - pt away if she wants to get pregnant that she needs to let us know before trying so we can take her off the wegovy, her bp meds and trazodone. Pt understands

## 2023-01-10 NOTE — Progress Notes (Signed)
Established patient visit   Patient: Tanya Yates   DOB: May 18, 1995   27 y.o. Female  MRN: [id number] Visit Date: 01/10/2023  Today's healthcare provider: Charlton Amor, DO   Chief Complaint  Patient presents with   Medical Management of Chronic Issues    HTN wegovy f/u    SUBJECTIVE    Chief Complaint  Patient presents with   Medical Management of Chronic Issues    HTN wegovy f/u   HPI HPI     Medical Management of Chronic Issues    Additional comments: HTN wegovy f/u      Last edited by Roselyn Reef, CMA on 01/10/2023  3:27 PM.       Pt presents for follow up on wegovy and HTN.   HTN - amlodipine 5mg   - hydrochlorothiazide 12.5mg    Weight management Wegovy 1.7  Primary insomnia - on trazodone 50mg     Review of Systems  Constitutional:  Negative for activity change, fatigue and fever.  Respiratory:  Negative for cough and shortness of breath.   Cardiovascular:  Negative for chest pain.  Gastrointestinal:  Negative for abdominal pain.  Genitourinary:  Negative for difficulty urinating.       Current Meds  Medication Sig   amLODipine (NORVASC) 5 MG tablet TAKE 1 TABLET (5 MG TOTAL) BY MOUTH DAILY.   clotrimazole (LOTRIMIN) 1 % cream Apply 1 Application topically 2 (two) times daily.   hydrochlorothiazide (HYDRODIURIL) 12.5 MG tablet TAKE 1 TABLET BY MOUTH EVERY DAY   tranexamic acid (LYSTEDA) 650 MG TABS tablet Take 2 tablets (1,300 mg total) by mouth 3 (three) times daily. Take during menses for a maximum of five days   traZODone (DESYREL) 50 MG tablet Take 0.5 tablets (25 mg total) by mouth at bedtime.   triamcinolone cream (KENALOG) 0.5 % Apply 1 Application topically 2 (two) times daily. To affected areas.   WEGOVY 2.4 MG/0.75ML SOAJ Inject 2.4 mg into the skin once a week.   [DISCONTINUED] WEGOVY 1.7 MG/0.75ML SOAJ Inject 1.7 mg into the skin once a week. Use this dose for 1 month (4 shots) and then increase to next higher  dose.    OBJECTIVE    BP 129/88 (BP Location: Left Arm, Patient Position: Sitting, Cuff Size: Large)   Pulse 89   Ht 5\' 3"  (1.6 m)   Wt 181 lb 12 oz (82.4 kg)   SpO2 98%   BMI 32.20 kg/m   Physical Exam Vitals and nursing note reviewed.  Constitutional:      General: She is not in acute distress.    Appearance: Normal appearance.  HENT:     Head: Normocephalic and atraumatic.     Right Ear: External ear normal.     Left Ear: External ear normal.     Nose: Nose normal.  Eyes:     Conjunctiva/sclera: Conjunctivae normal.  Cardiovascular:     Rate and Rhythm: Normal rate and regular rhythm.  Pulmonary:     Effort: Pulmonary effort is normal.     Breath sounds: Normal breath sounds.  Neurological:     General: No focal deficit present.     Mental Status: She is alert and oriented to person, place, and time.  Psychiatric:        Mood and Affect: Mood normal.        Behavior: Behavior normal.        Thought Content: Thought content normal.        Judgment: Judgment  normal.        ASSESSMENT & PLAN    Problem List Items Addressed This Visit       Cardiovascular and Mediastinum   Primary hypertension    - Pt on amlodipine 5mg  and hydrochlorothiazide 12.5mg   - doing well, will continue - she wants to know when she can get off this medicine. Discussed that if she monitors bp in the 110s we can try to get her off the hydrochlorothiazide and see         Other   Class 2 severe obesity due to excess calories with serious comorbidity and body mass index (BMI) of 36.0 to 36.9 in adult [facility]) - Primary    Pt doing well, would like to increase dosage, sending in 2.4mg   - pt away if she wants to get pregnant that she needs to let us know before trying so we can take her off the wegovy, her bp meds and trazodone. Pt understands      Relevant Medications   WEGOVY 2.4 MG/0.75ML SOAJ   Primary insomnia    Pt doing well on trazodone       Return in about 3 months (around  04/12/2023) for HTN.      Meds ordered this encounter  Medications   WEGOVY 2.4 MG/0.75ML SOAJ    Sig: Inject 2.4 mg into the skin once a week.    Dispense:  3 mL    Refill:  4    No orders of the defined types were placed in this encounter.    Charlton Amor, DO  [facility] Health Primary Care & Sports Medicine at [facility] [phone] (phone) [phone] (fax)  [facility]

## 2023-01-10 NOTE — Assessment & Plan Note (Addendum)
-   Pt on amlodipine 5mg  and hydrochlorothiazide 12.5mg   - doing well, will continue - she wants to know when she can get off this medicine. Discussed that if she monitors bp in the 110s we can try to get her off the hydrochlorothiazide and see

## 2023-01-10 NOTE — Assessment & Plan Note (Signed)
Pt doing well on trazodone

## 2023-03-16 ENCOUNTER — Encounter: Payer: Self-pay | Admitting: Family Medicine

## 2023-04-12 ENCOUNTER — Ambulatory Visit: Payer: BC Managed Care – PPO | Admitting: Family Medicine

## 2023-04-12 VITALS — BP 126/85 | HR 92 | Ht 63.0 in | Wt 175.5 lb

## 2023-04-12 DIAGNOSIS — R7303 Prediabetes: Secondary | ICD-10-CM

## 2023-04-12 DIAGNOSIS — E66812 Obesity, class 2: Secondary | ICD-10-CM

## 2023-04-12 DIAGNOSIS — D508 Other iron deficiency anemias: Secondary | ICD-10-CM

## 2023-04-12 DIAGNOSIS — Z6836 Body mass index (BMI) 36.0-36.9, adult: Secondary | ICD-10-CM

## 2023-04-12 DIAGNOSIS — I10 Essential (primary) hypertension: Secondary | ICD-10-CM | POA: Diagnosis not present

## 2023-04-12 NOTE — Assessment & Plan Note (Signed)
-   pt doing well on BP medication. She is interested on coming off medication. We discussed that if she can lose another 10lbs we may be able to wean her off medication - will get BMP today

## 2023-04-12 NOTE — Assessment & Plan Note (Signed)
Pt had last wegovy shot on 03/17/23 and has been unable to get medication. Advised her to call insurance. Unclear if this is related to needing another PA but we have reached out to our PA team to determine

## 2023-04-12 NOTE — Progress Notes (Signed)
Established patient visit   Patient: Tanya Yates   DOB: 11-02-1995   28 y.o. Female  MRN: [id number] Visit Date: 04/12/2023  Today's healthcare provider: Charlton Amor, DO   Chief Complaint  Patient presents with   Medical Management of Chronic Issues    HTN    SUBJECTIVE    Chief Complaint  Patient presents with   Medical Management of Chronic Issues    HTN   HPI HPI     Medical Management of Chronic Issues    Additional comments: HTN      Last edited by Roselyn Reef, CMA on 04/12/2023  3:30 PM.      Pt presents for HTN follow up. On amlodipine 10mg  and hydrochlorothiazide 12.5mg .   Has questions about her wegovy. She has been unable to obtain a refill.   Review of Systems  Constitutional:  Negative for activity change, fatigue and fever.  Respiratory:  Negative for cough and shortness of breath.   Cardiovascular:  Negative for chest pain.  Gastrointestinal:  Negative for abdominal pain.  Genitourinary:  Negative for difficulty urinating.       Current Meds  Medication Sig   amLODipine (NORVASC) 5 MG tablet TAKE 1 TABLET (5 MG TOTAL) BY MOUTH DAILY.   clotrimazole (LOTRIMIN) 1 % cream Apply 1 Application topically 2 (two) times daily.   hydrochlorothiazide (HYDRODIURIL) 12.5 MG tablet TAKE 1 TABLET BY MOUTH EVERY DAY   tranexamic acid (LYSTEDA) 650 MG TABS tablet Take 2 tablets (1,300 mg total) by mouth 3 (three) times daily. Take during menses for a maximum of five days   traZODone (DESYREL) 50 MG tablet Take 0.5 tablets (25 mg total) by mouth at bedtime.   triamcinolone cream (KENALOG) 0.5 % Apply 1 Application topically 2 (two) times daily. To affected areas.    OBJECTIVE    BP 126/85 (BP Location: Left Arm, Patient Position: Sitting, Cuff Size: Large)   Pulse 92   Wt 175 lb 8 oz (79.6 kg)   BMI 31.09 kg/m   Physical Exam Vitals and nursing note reviewed.  Constitutional:      General: She is not in acute distress.     Appearance: Normal appearance.  HENT:     Head: Normocephalic and atraumatic.     Right Ear: External ear normal.     Left Ear: External ear normal.     Nose: Nose normal.  Eyes:     Conjunctiva/sclera: Conjunctivae normal.  Cardiovascular:     Rate and Rhythm: Normal rate and regular rhythm.  Pulmonary:     Effort: Pulmonary effort is normal.     Breath sounds: Normal breath sounds.  Neurological:     General: No focal deficit present.     Mental Status: She is alert and oriented to person, place, and time.  Psychiatric:        Mood and Affect: Mood normal.        Behavior: Behavior normal.        Thought Content: Thought content normal.        Judgment: Judgment normal.        ASSESSMENT & PLAN    Problem List Items Addressed This Visit       Cardiovascular and Mediastinum   Primary hypertension   - pt doing well on BP medication. She is interested on coming off medication. We discussed that if she can lose another 10lbs we may be able to wean her off medication - will get  BMP today      Relevant Orders   Basic Metabolic Panel (BMET)     Other   Anemia   Pt has not been taking iron supplement. Will go ahead and order blood work      Relevant Orders   Iron, TIBC and Ferritin Panel   Class 2 severe obesity due to excess calories with serious comorbidity and body mass index (BMI) of 36.0 to 36.9 in adult [facility]) - Primary   Pt had last wegovy shot on 03/17/23 and has been unable to get medication. Advised her to call insurance. Unclear if this is related to needing another PA but we have reached out to our PA team to determine      Prediabetes   Have gone ahead and ordered A1c       Relevant Orders   HgB A1c    Return in about 6 months (around 10/10/2023).      No orders of the defined types were placed in this encounter.   Orders Placed This Encounter  Procedures   Basic Metabolic Panel (BMET)   Iron, TIBC and Ferritin Panel   HgB A1c     Charlton Amor, DO  [facility] & Sports Medicine at [facility] [phone] (phone) [phone] (fax)  [facility]

## 2023-04-12 NOTE — Assessment & Plan Note (Signed)
Have gone ahead and ordered A1c

## 2023-04-12 NOTE — Assessment & Plan Note (Signed)
Pt has not been taking iron supplement. Will go ahead and order blood work

## 2023-04-13 ENCOUNTER — Encounter: Payer: Self-pay | Admitting: Family Medicine

## 2023-04-13 ENCOUNTER — Telehealth: Payer: Self-pay

## 2023-04-13 LAB — HEMOGLOBIN A1C
Est. average glucose Bld gHb Est-mCnc: 108 mg/dL
Hgb A1c MFr Bld: 5.4 % (ref 4.8–5.6)

## 2023-04-13 LAB — IRON,TIBC AND FERRITIN PANEL
Ferritin: 16 ng/mL (ref 15–150)
Iron Saturation: 11 % — ABNORMAL LOW (ref 15–55)
Iron: 39 ug/dL (ref 27–159)
Total Iron Binding Capacity: 358 ug/dL (ref 250–450)
UIBC: 319 ug/dL (ref 131–425)

## 2023-04-13 LAB — BASIC METABOLIC PANEL
BUN/Creatinine Ratio: 20 (ref 9–23)
BUN: 12 mg/dL (ref 6–20)
CO2: 24 mmol/L (ref 20–29)
Calcium: 9.2 mg/dL (ref 8.7–10.2)
Chloride: 99 mmol/L (ref 96–106)
Creatinine, Ser: 0.61 mg/dL (ref 0.57–1.00)
Glucose: 82 mg/dL (ref 70–99)
Potassium: 3.7 mmol/L (ref 3.5–5.2)
Sodium: 138 mmol/L (ref 134–144)
eGFR: 124 mL/min/{1.73_m2} (ref >59)

## 2023-04-13 NOTE — Telephone Encounter (Signed)
Per insurance and [employer]  - patient's insurance is not active. Patient will need to contact the insurance to update coverage and pharmacy benefits. Patient was informed of the following concern. She will contact her insurance directly to update the necessary information. Patient was informed to call into the clinic or to send a Mychart msg as soon as she has her new Pharmacologist. Patient agreeable with plan.

## 2023-04-14 ENCOUNTER — Encounter: Payer: Self-pay | Admitting: Family Medicine

## 2023-04-21 ENCOUNTER — Other Ambulatory Visit: Payer: Self-pay | Admitting: Family Medicine

## 2023-04-21 NOTE — Telephone Encounter (Signed)
Copied from CRM [id number]. Topic: Clinical - Medication Refill >> Apr 21, 2023  5:23 PM Thomes Dinning wrote: Most Recent Primary Care Visit:  Provider: Charlton Amor  Department: [facility]  Visit Type: OFFICE VISIT  Date: 04/12/2023  Medication: WEGOVY 2.4 MG/0.75ML SOAJ  Has the patient contacted their pharmacy? No (Agent: If no, request that the patient contact the pharmacy for the refill. If patient does not wish to contact the pharmacy document the reason why and proceed with request.) (Agent: If yes, when and what did the pharmacy advise?)  Is this the correct pharmacy for this prescription? Yes If no, delete pharmacy and type the correct one.  This is the patient's preferred pharmacy:  [facility] [phone] - [place], [place] - [address]  [place] [address] Phone: [phone] Fax: [phone]   Has the prescription been filled recently? Yes  Is the patient out of the medication? Yes  Has the patient been seen for an appointment in the last year OR does the patient have an upcoming appointment? Yes  Can we respond through MyChart? Yes  Agent: Please be advised that Rx refills may take up to 3 business days. We ask that you follow-up with your pharmacy.

## 2023-04-27 MED ORDER — WEGOVY 2.4 MG/0.75ML ~~LOC~~ SOAJ: 2.4000 mg | SUBCUTANEOUS | 4 refills | Status: DC

## 2023-04-27 NOTE — Telephone Encounter (Signed)
 Forwarding to Dr. Benjamin Stain covering Tanya Yates.  And Tanya Yates regarding PA note.   Requesting rx rf of wegovey 2.4 Last written 01/10/2023 Last OV =04/12/2023 Upcoming appt = none     Per insurance and [employer]  - patient's insurance is not active. Patient will need to contact the insurance to update coverage and pharmacy benefits. Patient was informed of the following concern. She will contact her insurance directly to update the necessary information. Patient was informed to call into the clinic or to send a Mychart msg as soon as she has her new Pharmacologist. Patient agreeable with plan.     As of 04/13/23

## 2023-04-28 ENCOUNTER — Telehealth: Payer: Self-pay

## 2023-04-28 NOTE — Telephone Encounter (Signed)
 Copied from CRM [id number]. Topic: Clinical - Prescription Issue >> Apr 25, 2023  4:29 PM Elle L wrote: Reason for CRM: The patient needs a prior authorization for her WEGOVY 2.4 MG/0.75ML and is requesting a call back at [phone].

## 2023-04-30 ENCOUNTER — Other Ambulatory Visit: Payer: Self-pay | Admitting: Family Medicine

## 2023-05-02 ENCOUNTER — Encounter: Payer: Self-pay | Admitting: Family Medicine

## 2023-05-04 NOTE — Telephone Encounter (Signed)
 Patient called again to followup on PA for [facility].

## 2023-05-05 MED ORDER — WEGOVY 0.25 MG/0.5ML ~~LOC~~ SOAJ: 0.2500 mg | SUBCUTANEOUS | 0 refills | Status: DC

## 2023-05-05 NOTE — Telephone Encounter (Signed)
 New prescription sent for [facility] 0.25mg  weekly to get restarted after being off the medication for almost two months.   ___________________________________________ Thayer Ohm, DNP, APRN, FNP-BC Primary Care and Sports Medicine [facility] [place]

## 2023-05-26 ENCOUNTER — Telehealth: Payer: Self-pay

## 2023-05-26 ENCOUNTER — Other Ambulatory Visit (HOSPITAL_COMMUNITY): Payer: Self-pay

## 2023-05-26 NOTE — Telephone Encounter (Signed)
 Pharmacy Patient Advocate Encounter   Received notification from Pt Calls Messages that prior authorization for Wegovy 2.4 is required/requested.   Insurance verification completed.   The patient is insured through [facility] ADVANTAGE/RX ADVANCE .   Per test claim: Refill too soon. PA is not needed at this time. Medication was filled 05/26/23. Next eligible fill date is 07/07/23.

## 2023-06-01 MED ORDER — WEGOVY 0.5 MG/0.5ML ~~LOC~~ SOAJ: 0.5000 mg | SUBCUTANEOUS | 0 refills | Status: DC

## 2023-06-01 NOTE — Addendum Note (Signed)
 Addended by: Jomarie Longs on: 06/01/2023 04:50 PM   Modules accepted: Orders

## 2023-06-23 NOTE — Telephone Encounter (Signed)
 Forwarding request to Dr. Augustus Ledger covering Dr. Dianah Fort Patient requesting wegovy  strenth increase to 1mg  Last written as 0.5mg  06/01/2023 Last OV 04/12/2023 Upcoming appt = none

## 2023-06-24 MED ORDER — WEGOVY 1 MG/0.5ML ~~LOC~~ SOAJ: 1.0000 mg | SUBCUTANEOUS | 0 refills | Status: DC

## 2023-06-24 NOTE — Addendum Note (Signed)
 Addended by: Jamesa Tedrick E on: 06/24/2023 12:48 PM   Modules accepted: Orders

## 2023-06-24 NOTE — Telephone Encounter (Signed)
 Pharmacy Patient Advocate Encounter   Received notification from Pt Calls Messages that prior authorization for Wegovy 2.4 is required/requested.   Insurance verification completed.   The patient is insured through [facility] ADVANTAGE/RX ADVANCE .   Per test claim: Refill too soon. PA is not needed at this time. Medication was filled 05/26/23. Next eligible fill date is 07/07/23.

## 2023-07-07 ENCOUNTER — Encounter: Payer: Self-pay | Admitting: Family Medicine

## 2023-07-07 ENCOUNTER — Encounter: Payer: Self-pay | Admitting: Obstetrics & Gynecology

## 2023-07-22 ENCOUNTER — Encounter: Payer: Self-pay | Admitting: Family Medicine

## 2023-07-22 MED ORDER — WEGOVY 1.7 MG/0.75ML ~~LOC~~ SOAJ: 1.7000 mg | SUBCUTANEOUS | 0 refills | Status: DC

## 2023-07-24 ENCOUNTER — Other Ambulatory Visit: Payer: Self-pay | Admitting: Family Medicine

## 2023-08-15 ENCOUNTER — Telehealth: Payer: Self-pay | Admitting: *Deleted

## 2023-08-15 NOTE — Telephone Encounter (Signed)
 Returned call from 10:04 AM. Left patient a message to call and schedule annual.

## 2023-08-19 ENCOUNTER — Other Ambulatory Visit: Payer: Self-pay | Admitting: Physician Assistant

## 2023-08-19 ENCOUNTER — Ambulatory Visit: Admitting: Urgent Care

## 2023-08-19 VITALS — BP 124/92 | HR 103 | Resp 18 | Ht 63.0 in | Wt 180.8 lb

## 2023-08-19 DIAGNOSIS — I10 Essential (primary) hypertension: Secondary | ICD-10-CM | POA: Diagnosis not present

## 2023-08-19 DIAGNOSIS — R7303 Prediabetes: Secondary | ICD-10-CM

## 2023-08-19 DIAGNOSIS — R635 Abnormal weight gain: Secondary | ICD-10-CM | POA: Diagnosis not present

## 2023-08-19 MED ORDER — WEGOVY 2.4 MG/0.75ML ~~LOC~~ SOAJ: 2.4000 mg | SUBCUTANEOUS | 3 refills | Status: DC

## 2023-08-19 NOTE — Patient Instructions (Signed)
 Start taking Wegovy  2.4mg  weekly. Monitor weight and blood pressure at home. Return in 3 months fasting for an annual physical.

## 2023-08-19 NOTE — Progress Notes (Unsigned)
 Tanya Yates

## 2023-08-19 NOTE — Progress Notes (Unsigned)
   Established Patient Office Visit  Subjective:  Patient ID: Tanya Yates, female    DOB: 1995-05-15  Age: 28 y.o. MRN: [id number]  Chief Complaint  Patient presents with   Weight Check    And wegovy  f/u    HPI  {History (Optional):23778}  ROS: as noted in HPI  Objective:     BP (!) 124/92 (BP Location: Left Arm, Patient Position: Sitting, Cuff Size: Large)   Pulse (!) 103   Resp 18   Ht 5' 3 (1.6 m)   Wt 180 lb 12 oz (82 kg)   SpO2 99%   BMI 32.02 kg/m  BP Readings from Last 3 Encounters:  08/19/23 (!) 124/92  04/12/23 126/85  01/10/23 129/88   Wt Readings from Last 3 Encounters:  08/19/23 180 lb 12 oz (82 kg)  04/12/23 175 lb 8 oz (79.6 kg)  01/10/23 181 lb 12 oz (82.4 kg)      Physical Exam   No results found for any visits on 08/19/23.  {Labs (Optional):23779}  The ASCVD Risk score (Arnett DK, et al., 2019) failed to calculate for the following reasons:   The 2019 ASCVD risk score is only valid for ages 51 to 81  Assessment & Plan:  Primary hypertension -     Wegovy ; Inject 2.4 mg into the skin once a week.  Dispense: 3 mL; Refill: 3  Prediabetes -     Wegovy ; Inject 2.4 mg into the skin once a week.  Dispense: 3 mL; Refill: 3  Weight gain -     Wegovy ; Inject 2.4 mg into the skin once a week.  Dispense: 3 mL; Refill: 3     Return in about 3 months (around 11/19/2023) for Annual Physical.   Mandy Second, PA

## 2023-08-21 ENCOUNTER — Encounter: Payer: Self-pay | Admitting: Urgent Care

## 2023-08-30 ENCOUNTER — Ambulatory Visit: Admitting: Obstetrics and Gynecology

## 2023-09-05 ENCOUNTER — Encounter: Payer: Self-pay | Admitting: Urgent Care

## 2023-09-05 DIAGNOSIS — R110 Nausea: Secondary | ICD-10-CM

## 2023-09-05 MED ORDER — ONDANSETRON 8 MG PO TBDP: 8.0000 mg | ORAL_TABLET | Freq: Three times a day (TID) | ORAL | 3 refills | Status: AC | PRN

## 2023-09-13 ENCOUNTER — Ambulatory Visit: Admitting: Obstetrics and Gynecology

## 2023-09-13 ENCOUNTER — Encounter: Payer: Self-pay | Admitting: Obstetrics and Gynecology

## 2023-09-13 VITALS — BP 135/87 | HR 112 | Ht 63.0 in | Wt 177.0 lb

## 2023-09-13 DIAGNOSIS — Z0000 Encounter for general adult medical examination without abnormal findings: Secondary | ICD-10-CM | POA: Diagnosis not present

## 2023-09-13 DIAGNOSIS — Z1331 Encounter for screening for depression: Secondary | ICD-10-CM

## 2023-09-13 DIAGNOSIS — N469 Male infertility, unspecified: Secondary | ICD-10-CM | POA: Diagnosis not present

## 2023-09-13 DIAGNOSIS — Z3169 Encounter for other general counseling and advice on procreation: Secondary | ICD-10-CM | POA: Diagnosis not present

## 2023-09-13 NOTE — Patient Instructions (Signed)
 [facility]  [email or web address] Dr. Lovie

## 2023-09-13 NOTE — Progress Notes (Signed)
 GYNECOLOGY ANNUAL PREVENTATIVE CARE ENCOUNTER NOTE  History:     Tanya Yates is a 28 y.o. G0P0000 female here for a routine annual gynecologic exam.  Current complaints: None.   Denies abnormal vaginal bleeding, discharge, pelvic pain, problems with intercourse or other gynecologic concerns.  Currently on Wegovi, planning pregnancy. Will stop this. Has lost 40 lbs.  Has been actively trying to conceive for 2 months since weight loss.  Partner had semen analysis which showed that the concentration and motility were normal. The morphology was below reference ranges. Her partner started on vitamin C 1000 mg, vitamin D  400 units, folic acid  400 mcg, and zinc 50 mg daily   Dr. Cris recommend she have an HSG to evaluated her Fallopian tubes.   Gynecologic History Patient's last menstrual period was 08/30/2023 (exact date). Contraception: none Last Pap: 05/2021. Result was normal with negative HPV Last Mammogram: NA  Obstetric History OB History  Gravida Para Term Preterm AB Living  0 0 0 0 0 0  SAB IAB Ectopic Multiple Live Births  0 0 0 0 0    Past Medical History:  Diagnosis Date   Anemia    Hypertension    Vitamin D  deficiency     History reviewed. No pertinent surgical history.  Current Outpatient Medications on File Prior to Visit  Medication Sig Dispense Refill   amLODipine  (NORVASC ) 5 MG tablet TAKE 1 TABLET (5 MG TOTAL) BY MOUTH DAILY. 90 tablet 0   hydrochlorothiazide  (HYDRODIURIL ) 12.5 MG tablet TAKE 1 TABLET BY MOUTH EVERY DAY 90 tablet 1   ondansetron  (ZOFRAN -ODT) 8 MG disintegrating tablet Take 1 tablet (8 mg total) by mouth every 8 (eight) hours as needed for nausea. 20 tablet 3   Semaglutide -Weight Management (WEGOVY ) 2.4 MG/0.75ML SOAJ Inject 2.4 mg into the skin once a week. 3 mL 3   No current facility-administered medications on file prior to visit.    No Known Allergies  Social History:  reports that she has never smoked. She has  never been exposed to tobacco smoke. She has never used smokeless tobacco. She reports that she does not currently use alcohol. She reports that she does not use drugs.  Family History  Problem Relation Age of Onset   Healthy Mother    Healthy Father     The following portions of the patient's history were reviewed and updated as appropriate: allergies, current medications, past family history, past medical history, past social history, past surgical history and problem list.  Review of Systems Pertinent items noted in HPI and remainder of comprehensive ROS otherwise negative.  Physical Exam:  BP 135/87   Pulse (!) 112   Ht 5' 3 (1.6 m)   Wt 177 lb (80.3 kg)   LMP 08/30/2023 (Exact Date)   BMI 31.35 kg/m  CONSTITUTIONAL: Well-developed, well-nourished female in no acute distress.  HENT:  Normocephalic, atraumatic, External right and left ear normal.  EYES: Conjunctivae and EOM are normal. Pupils are equal, round, and reactive to light. No scleral icterus.  NECK: Normal range of motion, supple, no masses.  Normal thyroid .  SKIN: Skin is warm and dry. No rash noted. Not diaphoretic. No erythema. No pallor. MUSCULOSKELETAL: Normal range of motion. No tenderness.  No cyanosis, clubbing, or edema. NEUROLOGIC: Alert and oriented to person, place, and time. Normal reflexes, muscle tone coordination.  PSYCHIATRIC: Normal mood and affect. Normal behavior. Normal judgment and thought content. CARDIOVASCULAR: Normal heart rate noted, regular rhythm RESPIRATORY: Clear to auscultation bilaterally.  Effort and breath sounds normal, no problems with respiration noted. BREASTS: Symmetric in size. No masses, tenderness, skin changes, nipple drainage, or lymphadenopathy bilaterally. Performed in the presence of a chaperone. ABDOMEN: Soft, no distention noted.  No tenderness, rebound or guarding.  PELVIC: deferred  Assessment and Plan:   1. Annual wellness visit (Primary)  HgbA1c now within normal  range Has lost over 40 lbs!!!! 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 Cris recommend the patient contact a urologist that specializes in female infertility.    [facility] recommended: Dr. Lovie  Patient to call. Message sent to our office to schedule HSG   3. Encounter for preconception consultation  Stop Wegovy   Start prenatal vitamins and folic acid  supplement.  Would consider referral to [facility] if the above are ineffective.    Routine preventative health maintenance measures emphasized. Please refer to After Visit Summary for other counseling recommendations.    Terique Kawabata, Delon FERNS, NP [facility] for [employer], [facility]

## 2023-10-28 DIAGNOSIS — I10 Essential (primary) hypertension: Secondary | ICD-10-CM

## 2023-10-28 MED ORDER — AMLODIPINE BESYLATE 5 MG PO TABS: 5.0000 mg | ORAL_TABLET | Freq: Every day | ORAL | 1 refills | Status: DC

## 2023-11-01 DIAGNOSIS — Z3141 Encounter for fertility testing: Secondary | ICD-10-CM | POA: Diagnosis not present

## 2023-11-15 ENCOUNTER — Encounter: Payer: Self-pay | Admitting: Obstetrics and Gynecology

## 2023-11-24 ENCOUNTER — Telehealth: Payer: Self-pay | Admitting: *Deleted

## 2023-11-24 ENCOUNTER — Encounter: Admitting: Urgent Care

## 2023-11-24 NOTE — Telephone Encounter (Signed)
 Returned call from 10:02 AM. Patient requested a call back after 3:00 PM. Left patient a message to call and schedule.

## 2023-12-13 ENCOUNTER — Ambulatory Visit: Admitting: Obstetrics and Gynecology

## 2023-12-16 ENCOUNTER — Encounter: Admitting: Urgent Care

## 2023-12-26 ENCOUNTER — Encounter: Payer: Self-pay | Admitting: Obstetrics and Gynecology

## 2023-12-26 ENCOUNTER — Ambulatory Visit: Admitting: Obstetrics and Gynecology

## 2023-12-26 VITALS — BP 147/86 | HR 79 | Ht 63.0 in | Wt 178.0 lb

## 2023-12-26 DIAGNOSIS — Z319 Encounter for procreative management, unspecified: Secondary | ICD-10-CM

## 2023-12-26 DIAGNOSIS — N979 Female infertility, unspecified: Secondary | ICD-10-CM

## 2023-12-26 NOTE — Progress Notes (Signed)
   RETURN GYNECOLOGY VISIT  Subjective:  Tanya Yates is a 28 y.o. G0P0000 with LMP 12/13/23 presenting for HSG follow up  TTC x 2 years. Was doing timed IC with ovulation predictor kits that confirmed monthly ovulation. Took a hiatus while she was on a GLP-1 to allow for weight loss. Her partner has had 2 mildly abnormal semen analyses for which he is following with urology. He had a third semen analysis and meets with his doctor next week to review results.   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 Has been off GLP-1 for ~2 months  Objective:   Vitals:   12/26/23 1332 12/26/23 1335  BP: (!) 157/88 (!) 147/86  Pulse: 92 79  Weight: 178 lb (80.7 kg)   Height: 5' 3 (1.6 m)    General:  Alert, oriented and cooperative. Patient is in no acute distress.  Skin: Skin is warm and dry. No rash noted.   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 Johnston - Discussed that they will likely recommend IUI or IVF and what that process may look like - Continue PNV, timed IC - Follow up prn  Future Appointments  Date Time Provider Department Center  03/16/2024  3:20 PM Lowella Benton [place], [place] [facility] Bonni Kieth JAYSON Erik, MD

## 2023-12-30 ENCOUNTER — Encounter: Admitting: Urgent Care

## 2024-03-12 ENCOUNTER — Encounter: Payer: Self-pay | Admitting: Urgent Care

## 2024-03-12 ENCOUNTER — Ambulatory Visit: Admitting: Urgent Care

## 2024-03-12 VITALS — BP 137/85 | HR 89 | Ht 63.0 in | Wt 189.0 lb

## 2024-03-12 DIAGNOSIS — Z23 Encounter for immunization: Secondary | ICD-10-CM

## 2024-03-12 DIAGNOSIS — Z0000 Encounter for general adult medical examination without abnormal findings: Secondary | ICD-10-CM | POA: Diagnosis not present

## 2024-03-12 DIAGNOSIS — I10 Essential (primary) hypertension: Secondary | ICD-10-CM | POA: Diagnosis not present

## 2024-03-12 DIAGNOSIS — L2089 Other atopic dermatitis: Secondary | ICD-10-CM | POA: Diagnosis not present

## 2024-03-12 DIAGNOSIS — R7303 Prediabetes: Secondary | ICD-10-CM

## 2024-03-12 MED ORDER — HYDROCHLOROTHIAZIDE 12.5 MG PO TABS: 12.5000 mg | ORAL_TABLET | Freq: Every day | ORAL | 1 refills | Status: AC

## 2024-03-12 MED ORDER — NIFEDIPINE ER OSMOTIC RELEASE 30 MG PO TB24: 30.0000 mg | ORAL_TABLET | Freq: Every day | ORAL | 1 refills | Status: AC

## 2024-03-12 NOTE — Patient Instructions (Signed)
 Please stop your amlodipine  and switch to nifedipine . This is safer in pregnancy. Continue hydrochlorothiazide   Please return for BP recheck only in 2 weeks.  Please return for fasting labs. Your flu test was updated today.

## 2024-03-12 NOTE — Progress Notes (Unsigned)
 "  Annual Wellness Visit     Patient: Tanya Yates, Female    DOB: 09/18/1995, 29 y.o.   MRN: [id number]  Subjective  Chief Complaint  Patient presents with   Annual Exam    Tanya Yates is a 29 y.o. female who presents today for her Annual Wellness Visit. She reports consuming a general diet. Gym/ health club routine includes cardio and mod to heavy weightlifting. She generally feels well. She reports sleeping well. She does not have additional problems to discuss today.   HPI  Vision:Within last year and Dental: No current dental problems and Receives regular dental care  Discussed the use of AI scribe software for clinical note transcription with the patient, who gave verbal consent to proceed.  History of Present Illness   Tanya Yates is a 29 year old female who presents for an annual physical exam and discussion of fertility concerns.  She has been attempting to conceive for approximately three years. An HSG test in September 2024 showed bilateral tubal patency. She is not currently on any hormonal treatments and has not had any recent fertility-related labs done.  She discontinued Wegovy  in September of last year due to side effects and because she and her husband are trying to conceive. She was on Wegovy  for about a year and experienced weight loss during that time. Since stopping Wegovy , she has noticed some weight gain, particularly around the holiday season. She maintains a regular exercise routine, including weight training and cardio, and reports feeling great overall with improved mobility.  Her current medications for blood pressure include Norvasc  and hydrochlorothiazide . She has a history of low vitamin D  and iron levels, but recent labs from August showed normal levels. She is not currently taking an iron supplement.  She has a dark spot on her skin that sometimes itches. She has previously been prescribed clotrimazole   and triamcinolone  creams, but has not seen improvement.  Her diet is well-rounded, including calcium, meat, and vegetables. She reports good sleep quality, falling asleep easily and staying asleep throughout the night. She is up to date with her dental and eye care appointments.       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 No pertinent surgical history. Social History[1]    Medications: Show/hide medication list[2]  Allergies[3]  Patient Care Team: Lowella Benton CROME, [place] as PCP - General (Physician Assistant)  ROS Complete 12 point ROS performed with all pertinent positives listed in HPI     Objective  BP 137/85   Pulse 89   Ht 5' 3 (1.6 m)   Wt 189 lb (85.7 kg)   SpO2 99%   BMI 33.48 kg/m  BP Readings from Last 3 Encounters:  03/12/24 137/85  12/26/23 (!) 147/86  09/13/23 135/87   Wt Readings from Last 3 Encounters:  03/12/24 189 lb (85.7 kg)  12/26/23 178 lb (80.7  kg)  09/13/23 177 lb (80.3 kg)      Physical Exam Vitals and nursing note reviewed.  Constitutional:      General: She is not in acute distress.    Appearance: Normal appearance. She is not ill-appearing, toxic-appearing or diaphoretic.  HENT:     Head: Normocephalic and atraumatic.     Right Ear: Tympanic membrane, ear canal and external ear normal. There is no impacted cerumen.     Left Ear: Tympanic membrane, ear canal and external ear  normal. There is no impacted cerumen.     Nose: Nose normal.     Mouth/Throat:     Mouth: Mucous membranes are moist.     Pharynx: Oropharynx is clear. No oropharyngeal exudate or posterior oropharyngeal erythema.  Eyes:     General: No scleral icterus.       Right eye: No discharge.        Left eye: No discharge.     Extraocular Movements: Extraocular movements intact.     Pupils: Pupils are equal, round, and reactive to light.  Neck:     Thyroid : No thyroid  mass, thyromegaly or thyroid  tenderness.  Cardiovascular:     Rate and Rhythm: Normal rate and regular rhythm.     Pulses: Normal pulses.     Heart sounds: No murmur heard. Pulmonary:     Effort: Pulmonary effort is normal. No respiratory distress.     Breath sounds: Normal breath sounds. No stridor. No wheezing or rhonchi.  Abdominal:     General: Abdomen is flat. Bowel sounds are normal. There is no distension.     Palpations: Abdomen is soft. There is no mass.     Tenderness: There is no abdominal tenderness. There is no guarding.  Musculoskeletal:     Cervical back: Normal range of motion and neck supple. No rigidity or tenderness.     Right lower leg: No edema.     Left lower leg: No edema.  Lymphadenopathy:     Cervical: No cervical adenopathy.  Skin:    General: Skin is warm and dry.     Coloration: Skin is not jaundiced.     Findings: No bruising, erythema or rash.     Comments: Skin hyperpigmentation and scaling midline chest between breasts  Neurological:     General: No focal deficit present.     Mental Status: She is alert and oriented to person, place, and time.     Sensory: No sensory deficit.     Motor: No weakness.  Psychiatric:        Mood and Affect: Mood normal.        Behavior: Behavior normal.     Most recent depression screenings:    03/12/2024    4:30 PM 09/13/2023    3:22 PM  PHQ 2/9 Scores  PHQ - 2 Score 0 0  PHQ- 9 Score 0 0      Data saved with a previous flowsheet row definition     Fall Screening Falls in the past year?: 0 Number of falls in past year: 0 Was there an injury with Fall?: 0 Fall Risk Category Calculator: 0 Patient Fall Risk Level: Low Fall Risk  Fall Risk Patient at Risk for Falls Due to: No Fall Risks Fall risk Follow up: Falls evaluation completed    Vision/Hearing Screen: No results found.  Last CBC Lab Results  Component Value Date   WBC 6.2 11/10/2022   HGB 12.3 11/10/2022   HCT 39.1 11/10/2022  MCV 83 11/10/2022   MCH 26.0 (L) 11/10/2022   RDW 15.0 11/10/2022   PLT 324 11/10/2022   Last metabolic panel Lab Results  Component Value Date   GLUCOSE 82 04/12/2023   NA 138 04/12/2023   K 3.7 04/12/2023   CL 99 04/12/2023   CO2 24 04/12/2023   BUN 12 04/12/2023   CREATININE 0.61 04/12/2023   EGFR 124 04/12/2023   CALCIUM 9.2 04/12/2023   PROT 8.0 06/15/2022   BILITOT 0.4 06/15/2022   AST 18 06/15/2022   ALT 17 06/15/2022   Last lipids No results found for: CHOL, HDL, LDLCALC, LDLDIRECT, TRIG, CHOLHDL Last hemoglobin A1c Lab Results  Component Value Date   HGBA1C 5.4 04/12/2023   Last thyroid  functions No results found for: TSH, T3TOTAL, T4TOTAL, FREET4, THYROIDAB Last vitamin D  Lab Results  Component Value Date   VD25OH 30.5 10/12/2022   Last vitamin B12 and Folate Lab Results  Component Value Date   VITAMINB12 537 06/15/2022      No results found for any visits on 03/12/24.    Assessment & Plan   Annual wellness visit done today including the all of the following: Reviewed patient's Family and Medical History Reviewed and updated list of patient's medical providers Assessment of cognitive impairment was done Assessed patient's functional ability Established a written schedule for health screening services Health Risk Assessent Completed and Reviewed  Exercise Activities and Dietary recommendations  Goals   None     Immunization History  Administered Date(s)  Administered   DTaP 12/31/1993, 03/23/1994, 05/19/1994, 12/22/1995, 07/25/1998   HIB, Unspecified 12/31/1993, 03/23/1994, 05/19/1994, 04/16/1998   HPV 9-valent 01/12/2012   HPV Quadrivalent 07/14/2009, 10/13/2009, 01/12/2012   Hepatitis A, Ped/Adol-2 Dose 07/14/2009, 01/12/2012   Hepatitis B, PED/ADOLESCENT December 11, 1993, 12/31/1993, 05/19/1994   Hpv-Unspecified 01/12/2012   Influenza, Seasonal, Injecte, Preservative Fre 11/09/2022, 03/12/2024   MMR 12/22/1995, 07/25/1998   Meningococcal Acwy, Unspecified 07/14/2009, 01/12/2012   Meningococcal Conjugate 01/12/2012   OPV 12/31/1993, 03/23/1994, 05/19/1994, 07/25/1998   PFIZER(Purple Top)SARS-COV-2 Vaccination 05/26/2019, 06/16/2019   Tdap 07/14/2009, 10/13/2021   Varicella 07/14/2009, 01/12/2012    Health Maintenance  Topic Date Due   HIV Screening  Never done   Hepatitis C Screening  Never done   COVID-19 Vaccine (3 - 2025-26 season) 03/28/2024 (Originally 10/31/2023)   Cervical Cancer Screening (HPV/Pap Cotest)  10/20/2024   DTaP/Tdap/Td (8 - Td or Tdap) 10/14/2031   Influenza Vaccine  Completed   Hepatitis B Vaccines 19-59 Average Risk  Completed   HPV VACCINES  Completed   Pneumococcal Vaccine  Aged Out   Meningococcal B Vaccine  Aged Out     Discussed health benefits of physical activity, and encouraged her to engage in regular exercise appropriate for her age and condition.    Problem List Items Addressed This Visit       Cardiovascular and Mediastinum   Primary hypertension   Relevant Medications   hydrochlorothiazide  (HYDRODIURIL ) 12.5 MG tablet   NIFEdipine  (PROCARDIA -XL/NIFEDICAL-XL) 30 MG 24 hr tablet   Other Relevant Orders   CMP14+EGFR     Other   Prediabetes   Relevant Orders   CMP14+EGFR   HgB A1c   Lipid panel   Other Visit Diagnoses       Routine adult health maintenance    -  Primary   Relevant Orders   CMP14+EGFR   CBC w/Diff/Platelet   HgB A1c   TSH + free T4   Lipid panel     Flu vaccine  need  Relevant Orders   Flu vaccine trivalent PF, 6mos and older(Flulaval,Afluria,Fluarix,Fluzone) (Completed)     Other atopic dermatitis       Relevant Medications   Crisaborole  (EUCRISA ) 2 % OINT      Assessment and Plan    Woman's Wellness Visit Routine annual wellness visit with well-controlled blood pressure. Discussed routine lab work and fasting for cholesterol levels. - Administered flu vaccine. - Ordered routine lab work including fasting cholesterol. - Provided lab slip for fasting lab draw.  Primary hypertension Blood pressure well-controlled with current medications. Emphasized maintaining control, especially regarding potential pregnancy. Current meds are not the preferred agents during pregnancy - discontinue Norvasc  - continue hydrochlorothiazide  - add nifedipine   Female infertility Trying to conceive for three years. Previous HSG showed bilateral tubal patency. Discussed infertility labs, decided to follow up with fertility clinic. - Follow up with fertility clinic for further evaluation. - Ordered routine annual labs.  Eczema Slightly raised, darker spot likely eczema. Previous treatments ineffective. Ddx includes melasma - Prescribed a different form of topical treatment for eczema.       Return in about 2 weeks (around 03/26/2024).     Benton LITTIE Gave, PA       [1]  Social History Tobacco Use   Smoking status: Never    Passive exposure: Never   Smokeless tobacco: Never  Vaping Use   Vaping status: Never Used  Substance Use Topics   Alcohol use: Not Currently   Drug use: Never  [2]  Outpatient Medications Prior to Visit  Medication Sig   ondansetron  (ZOFRAN -ODT) 8 MG disintegrating tablet Take 1 tablet (8 mg total) by mouth every 8 (eight) hours as needed for nausea.   [DISCONTINUED] amLODipine  (NORVASC ) 5 MG tablet Take 1 tablet (5 mg total) by mouth daily.   [DISCONTINUED] hydrochlorothiazide  (HYDRODIURIL ) 12.5 MG tablet TAKE 1  TABLET BY MOUTH EVERY DAY   [DISCONTINUED] Semaglutide -Weight Management (WEGOVY ) 2.4 MG/0.75ML SOAJ Inject 2.4 mg into the skin once a week.   No facility-administered medications prior to visit.  [3] No Known Allergies  "

## 2024-03-13 ENCOUNTER — Encounter: Payer: Self-pay | Admitting: Urgent Care

## 2024-03-13 MED ORDER — EUCRISA 2 % EX OINT: 1.0000 "application " | TOPICAL_OINTMENT | Freq: Two times a day (BID) | CUTANEOUS | 3 refills | Status: AC

## 2024-03-14 ENCOUNTER — Ambulatory Visit: Payer: Self-pay | Admitting: Urgent Care

## 2024-03-14 LAB — CMP14+EGFR
ALT: 24 IU/L (ref 0–32)
AST: 26 IU/L (ref 0–40)
Albumin: 4.4 g/dL (ref 4.0–5.0)
Alkaline Phosphatase: 69 IU/L (ref 41–116)
BUN/Creatinine Ratio: 14 (ref 9–23)
BUN: 12 mg/dL (ref 6–20)
Bilirubin Total: 0.4 mg/dL (ref 0.0–1.2)
CO2: 22 mmol/L (ref 20–29)
Calcium: 9.3 mg/dL (ref 8.7–10.2)
Chloride: 103 mmol/L (ref 96–106)
Creatinine, Ser: 0.83 mg/dL (ref 0.57–1.00)
Globulin, Total: 2.7 g/dL (ref 1.5–4.5)
Glucose: 96 mg/dL (ref 70–99)
Potassium: 4.4 mmol/L (ref 3.5–5.2)
Sodium: 139 mmol/L (ref 134–144)
Total Protein: 7.1 g/dL (ref 6.0–8.5)
eGFR: 97 mL/min/1.73

## 2024-03-14 LAB — CBC WITH DIFFERENTIAL/PLATELET
Basophils Absolute: 0 x10E3/uL (ref 0.0–0.2)
Basos: 1 %
EOS (ABSOLUTE): 0.1 x10E3/uL (ref 0.0–0.4)
Eos: 1 %
Hematocrit: 41.4 % (ref 34.0–46.6)
Hemoglobin: 13.3 g/dL (ref 11.1–15.9)
Immature Grans (Abs): 0 x10E3/uL (ref 0.0–0.1)
Immature Granulocytes: 0 %
Lymphocytes Absolute: 1.7 x10E3/uL (ref 0.7–3.1)
Lymphs: 28 %
MCH: 28.1 pg (ref 26.6–33.0)
MCHC: 32.1 g/dL (ref 31.5–35.7)
MCV: 87 fL (ref 79–97)
Monocytes Absolute: 0.5 x10E3/uL (ref 0.1–0.9)
Monocytes: 8 %
Neutrophils Absolute: 3.9 x10E3/uL (ref 1.4–7.0)
Neutrophils: 62 %
Platelets: 320 x10E3/uL (ref 150–450)
RBC: 4.74 x10E6/uL (ref 3.77–5.28)
RDW: 14.2 % (ref 11.7–15.4)
WBC: 6.2 x10E3/uL (ref 3.4–10.8)

## 2024-03-14 LAB — LIPID PANEL
Chol/HDL Ratio: 3 ratio (ref 0.0–4.4)
Cholesterol, Total: 272 mg/dL — ABNORMAL HIGH (ref 100–199)
HDL: 92 mg/dL
LDL Chol Calc (NIH): 156 mg/dL — ABNORMAL HIGH (ref 0–99)
Triglycerides: 139 mg/dL (ref 0–149)
VLDL Cholesterol Cal: 24 mg/dL (ref 5–40)

## 2024-03-14 LAB — HEMOGLOBIN A1C
Est. average glucose Bld gHb Est-mCnc: 108 mg/dL
Hgb A1c MFr Bld: 5.4 % (ref 4.8–5.6)

## 2024-03-14 LAB — TSH+FREE T4
Free T4: 1.3 ng/dL (ref 0.82–1.77)
TSH: 1.19 u[IU]/mL (ref 0.450–4.500)

## 2024-03-15 ENCOUNTER — Encounter: Payer: Self-pay | Admitting: Urgent Care

## 2024-03-16 ENCOUNTER — Encounter: Admitting: Urgent Care

## 2024-03-19 ENCOUNTER — Ambulatory Visit: Admitting: Urgent Care

## 2024-03-27 ENCOUNTER — Ambulatory Visit: Admitting: Urgent Care

## 2024-04-02 ENCOUNTER — Ambulatory Visit: Admitting: Urgent Care

## 2024-04-16 ENCOUNTER — Ambulatory Visit: Admitting: Urgent Care
